# Patient Record
Sex: Female | Born: 1990 | Race: Black or African American | Hispanic: No | Marital: Single | Smoking: Former smoker
Health system: Southern US, Community
[De-identification: ages and names within clinical notes are randomized; demographics above are authoritative.]

## PROBLEM LIST (undated history)

## (undated) ENCOUNTER — Inpatient Hospital Stay (HOSPITAL_COMMUNITY): Payer: Self-pay

## (undated) DIAGNOSIS — Z789 Other specified health status: Secondary | ICD-10-CM

## (undated) DIAGNOSIS — Z3490 Encounter for supervision of normal pregnancy, unspecified, unspecified trimester: Secondary | ICD-10-CM

## (undated) DIAGNOSIS — A549 Gonococcal infection, unspecified: Secondary | ICD-10-CM

## (undated) DIAGNOSIS — F329 Major depressive disorder, single episode, unspecified: Secondary | ICD-10-CM

## (undated) DIAGNOSIS — F32A Depression, unspecified: Secondary | ICD-10-CM

## (undated) HISTORY — PX: NO PAST SURGERIES: SHX2092

---
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
## 2011-03-15 DIAGNOSIS — R87619 Unspecified abnormal cytological findings in specimens from cervix uteri: Secondary | ICD-10-CM | POA: Insufficient documentation

## 2016-07-04 ENCOUNTER — Emergency Department (HOSPITAL_COMMUNITY): Payer: Medicaid Other

## 2016-07-04 ENCOUNTER — Emergency Department (HOSPITAL_COMMUNITY)
Admission: EM | Admit: 2016-07-04 | Discharge: 2016-07-04 | Disposition: A | Discharge status: Home / Self Care | Payer: Medicaid Other | Attending: Emergency Medicine | Admitting: Emergency Medicine

## 2016-07-04 ENCOUNTER — Encounter (HOSPITAL_COMMUNITY): Payer: Self-pay | Admitting: Emergency Medicine

## 2016-07-04 DIAGNOSIS — B9689 Other specified bacterial agents as the cause of diseases classified elsewhere: Secondary | ICD-10-CM | POA: Insufficient documentation

## 2016-07-04 DIAGNOSIS — Z5181 Encounter for therapeutic drug level monitoring: Secondary | ICD-10-CM | POA: Insufficient documentation

## 2016-07-04 DIAGNOSIS — F17210 Nicotine dependence, cigarettes, uncomplicated: Secondary | ICD-10-CM | POA: Insufficient documentation

## 2016-07-04 DIAGNOSIS — K5900 Constipation, unspecified: Secondary | ICD-10-CM | POA: Insufficient documentation

## 2016-07-04 DIAGNOSIS — N760 Acute vaginitis: Secondary | ICD-10-CM | POA: Insufficient documentation

## 2016-07-04 DIAGNOSIS — R1030 Lower abdominal pain, unspecified: Secondary | ICD-10-CM | POA: Diagnosis present

## 2016-07-04 LAB — RAPID URINE DRUG SCREEN, HOSP PERFORMED
AMPHETAMINES: NOT DETECTED
BENZODIAZEPINES: NOT DETECTED
Barbiturates: NOT DETECTED
Cocaine: NOT DETECTED
Opiates: NOT DETECTED
TETRAHYDROCANNABINOL: POSITIVE — AB

## 2016-07-04 LAB — URINE MICROSCOPIC-ADD ON

## 2016-07-04 LAB — POC URINE PREG, ED: PREG TEST UR: NEGATIVE

## 2016-07-04 LAB — URINALYSIS, ROUTINE W REFLEX MICROSCOPIC
Bilirubin Urine: NEGATIVE
GLUCOSE, UA: NEGATIVE mg/dL
HGB URINE DIPSTICK: NEGATIVE
Ketones, ur: NEGATIVE mg/dL
Nitrite: NEGATIVE
PROTEIN: NEGATIVE mg/dL
pH: 6 (ref 5.0–8.0)

## 2016-07-04 LAB — WET PREP, GENITAL
SPERM: NONE SEEN
TRICH WET PREP: NONE SEEN
Yeast Wet Prep HPF POC: NONE SEEN

## 2016-07-04 MED ORDER — METRONIDAZOLE 500 MG PO TABS: 500.0000 mg | ORAL_TABLET | Freq: Two times a day (BID) | ORAL | 0 refills | Status: DC

## 2016-07-04 MED ORDER — KETOROLAC TROMETHAMINE 60 MG/2ML IM SOLN
60.0000 mg | Freq: Once | INTRAMUSCULAR | Status: AC
  Administered 2016-07-04: 60 mg via INTRAMUSCULAR
  Filled 2016-07-04: qty 2

## 2016-07-04 MED ORDER — DICYCLOMINE HCL 10 MG/ML IM SOLN
20.0000 mg | Freq: Once | INTRAMUSCULAR | Status: AC
  Administered 2016-07-04: 20 mg via INTRAMUSCULAR
  Filled 2016-07-04: qty 2

## 2016-07-04 MED ORDER — METRONIDAZOLE 500 MG PO TABS
500.0000 mg | ORAL_TABLET | Freq: Once | ORAL | Status: AC
  Administered 2016-07-04: 500 mg via ORAL
  Filled 2016-07-04: qty 1

## 2016-07-04 NOTE — ED Provider Notes (Signed)
[facility]PT Provider Note   CSN: [id number] Arrival date & time: 07/04/16  0227  By signing my name below, I, Wendy Atkins, attest that this documentation has been prepared under the direction and in the presence of Tanicka Bisaillon, MD. Electronically Signed: Doreatha Atkins, ED Scribe. 07/04/16. 3:32 AM.     History   Chief Complaint Chief Complaint  Patient presents with  . Abdominal Pain    HPI Wendy Atkins is a 25 y.o. female who presents to the Emergency Department complaining of moderate, cramping lower abdominal pain onset one hour ago with associated constipation. Pt states her last BM yesterday was hard and small. LMP 04/15/16. She states she had a miscarriage weeks ago, had misoprostol placed 06/11/16, and has not had her HCG levels checked since d/t a missed appointment on 06/18/16. Pt also notes she experienced vaginal bleeding for 10 days s/p misoprostol placement 06/11/16, but this has currently resolved. Pt does complain of some vaginal discharge at this time. She denies vomiting, dysuria, hematuria, frequency, urgency, diarrhea.   The history is provided by the patient. No language interpreter was used.  Abdominal Pain   This is a new problem. The current episode started 1 to 2 hours ago. The problem occurs constantly. The problem has not changed since onset.Associated with: miscarriage in September. The pain is located in the suprapubic region. The pain is moderate. Associated symptoms include flatus and constipation. Pertinent negatives include anorexia, fever, diarrhea, vomiting, dysuria, frequency and hematuria. Nothing aggravates the symptoms. Nothing relieves the symptoms. Past workup does not include GI consult. Her past medical history does not include PUD.    History reviewed. No pertinent past medical history.  There are no active problems to display for this patient.   History reviewed. No pertinent surgical history.  OB History    No data available        Home Medications    Prior to Admission medications   Not on File    Family History History reviewed. No pertinent family history.  Social History Social History  Substance Use Topics  . Smoking status: Current Every Day Smoker    Packs/day: 0.50    Years: 5.00    Types: Cigarettes  . Smokeless tobacco: Never Used  . Alcohol use 1.2 oz/week    2 Shots of liquor per week     Allergies   Review of patient's allergies indicates no known allergies.   Review of Systems Review of Systems  Constitutional: Negative for fever.  Respiratory: Negative for shortness of breath.   Cardiovascular: Negative for chest pain.  Gastrointestinal: Positive for abdominal pain, constipation and flatus. Negative for anorexia, diarrhea and vomiting.  Genitourinary: Positive for vaginal discharge. Negative for dysuria, frequency, hematuria, urgency and vaginal bleeding.  All other systems reviewed and are negative.   Physical Exam Updated Vital Signs BP 127/85 (BP Location: Right Arm)   Pulse 86   Temp 98.1 F (36.7 C) (Oral)   Resp 14   Ht 5\' 7"  (1.702 m)   Wt 217 lb (98.4 kg)   LMP 04/15/2016 (Exact Date)   SpO2 96%   BMI 33.99 kg/m   Physical Exam  Constitutional: She is oriented to person, place, and time. She appears well-developed and well-nourished.  HENT:  Head: Normocephalic and atraumatic.  Mouth/Throat: Oropharynx is clear and moist. No oropharyngeal exudate.  Eyes: Conjunctivae and EOM are normal. Pupils are equal, round, and reactive to light.  Neck: Normal range of motion. Neck supple. No JVD present.  No tracheal deviation present.  No carotid bruits. Trachea midline.   Cardiovascular: Normal rate, regular rhythm, normal heart sounds and intact distal pulses.  Exam reveals no gallop and no friction rub.   No murmur heard. RRR.   Pulmonary/Chest: Effort normal and breath sounds normal. No stridor. No respiratory distress. She has no wheezes. She has no rales.   Lungs CTA bilaterally.   Abdominal: Soft. She exhibits no distension and no mass. There is no tenderness. There is no rebound and no guarding.  Extremely gassy throughout. Constipation.   Genitourinary:  Genitourinary Comments: Chaperone present, scant vaginal discharge no CMT or adnexal tenderness  Musculoskeletal: Normal range of motion.  Lymphadenopathy:    She has no cervical adenopathy.  Neurological: She is alert and oriented to person, place, and time. She has normal reflexes.  Skin: Skin is warm and dry. Capillary refill takes less than 2 seconds.  Psychiatric: She has a normal mood and affect.  Nursing note and vitals reviewed.    ED Treatments / Results   DIAGNOSTIC STUDIES: Oxygen Saturation is 96% on RA, adequate by my interpretation.    COORDINATION OF CARE: 3:11 AM Discussed treatment plan with pt at bedside which includes UA and pt agreed to plan.    Labs (all labs ordered are listed, but only abnormal results are displayed) Labs Reviewed  URINALYSIS, ROUTINE W REFLEX MICROSCOPIC (NOT [facility])  POC URINE PREG, ED    EKG  EKG Interpretation None       Radiology No results found.  Procedures Procedures (including critical care time)  Medications Ordered in ED Medications - No data to display   Initial Impression / Assessment and Plan / ED Course  I have reviewed the triage vital signs and the nursing notes.  Pertinent labs & imaging results that were available during my care of the patient were reviewed by me and considered in my medical decision making (see chart for details).  Vitals:   07/04/16 0330 07/04/16 0345  BP: 100/57 (!) 94/54  Pulse: 85 90  Resp:    Temp:     Medications  ketorolac (TORADOL) injection 60 mg (60 mg Intramuscular Given 07/04/16 0429)  dicyclomine (BENTYL) injection 20 mg (20 mg Intramuscular Given 07/04/16 0430)    Final Clinical Impressions(s) / ED Diagnoses   New Prescriptions New Prescriptions   No  medications on file   Suspect this is cramping from constipation.  I do not believe this has anything to do with the miscarriage as pregnancy test is negative vitals and exam are benign and reassuring.  Start miralax for constipation and increase fiber in your diet. Will also treat for bacterial vaginosis.  Follow up with GYN.  All questions answered to patient's satisfaction. Based on history and exam patient has been appropriately medically screened and emergency conditions excluded. Patient is stable for discharge at this time. Follow up with your PMD for recheck in 2 days and strict return precautions given.   I personally performed the services described in this documentation, which was scribed in my presence. The recorded information has been reviewed and is accurate.     Cy BlamerApril Maurianna Benard, MD 07/04/16 [phone]

## 2016-07-04 NOTE — ED Triage Notes (Signed)
Pt c/o severe abdominal pain with cramping that began 2 days ago. Reports miscarriage approx 2 weeks ago. Pain comes in waves. Pt reports it feels like labor pains, like she needs to push. Denies N/V/D. Had BM yesterday but states that it was hard and believes she is constipated as well.

## 2016-07-04 NOTE — ED Notes (Signed)
Pt departed in NAD, refused use of wheelchair.  

## 2016-07-05 LAB — GC/CHLAMYDIA PROBE AMP (~~LOC~~) NOT AT ARMC
CHLAMYDIA, DNA PROBE: NEGATIVE
Neisseria Gonorrhea: NEGATIVE

## 2017-01-07 ENCOUNTER — Encounter (HOSPITAL_COMMUNITY): Payer: Self-pay | Admitting: Emergency Medicine

## 2017-01-07 ENCOUNTER — Emergency Department (HOSPITAL_COMMUNITY)
Admission: EM | Admit: 2017-01-07 | Discharge: 2017-01-07 | Disposition: A | Discharge status: Home / Self Care | Payer: Medicaid Other | Attending: Emergency Medicine | Admitting: Emergency Medicine

## 2017-01-07 DIAGNOSIS — Z5321 Procedure and treatment not carried out due to patient leaving prior to being seen by health care provider: Secondary | ICD-10-CM | POA: Insufficient documentation

## 2017-01-07 DIAGNOSIS — R55 Syncope and collapse: Secondary | ICD-10-CM | POA: Insufficient documentation

## 2017-01-07 LAB — CBC
HCT: 34.1 % — ABNORMAL LOW (ref 36.0–46.0)
Hemoglobin: 11.5 g/dL — ABNORMAL LOW (ref 12.0–15.0)
MCH: 29.3 pg (ref 26.0–34.0)
MCHC: 33.7 g/dL (ref 30.0–36.0)
MCV: 87 fL (ref 78.0–100.0)
PLATELETS: 283 10*3/uL (ref 150–400)
RBC: 3.92 MIL/uL (ref 3.87–5.11)
RDW: 13 % (ref 11.5–15.5)
WBC: 7.6 10*3/uL (ref 4.0–10.5)

## 2017-01-07 LAB — BASIC METABOLIC PANEL
Anion gap: 9 (ref 5–15)
BUN: 8 mg/dL (ref 6–20)
CHLORIDE: 104 mmol/L (ref 101–111)
CO2: 19 mmol/L — ABNORMAL LOW (ref 22–32)
Calcium: 9 mg/dL (ref 8.9–10.3)
Creatinine, Ser: 0.57 mg/dL (ref 0.44–1.00)
GFR calc Af Amer: >60 mL/min (ref >60)
GFR calc non Af Amer: >60 mL/min (ref >60)
GLUCOSE: 99 mg/dL (ref 65–99)
POTASSIUM: 3.4 mmol/L — AB (ref 3.5–5.1)
Sodium: 132 mmol/L — ABNORMAL LOW (ref 135–145)

## 2017-01-07 NOTE — ED Notes (Signed)
Called patient for rooming with no response.

## 2017-01-07 NOTE — ED Notes (Signed)
Called for V/S and no answer x2 
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
## 2017-01-31 ENCOUNTER — Emergency Department (HOSPITAL_COMMUNITY)
Admission: EM | Admit: 2017-01-31 | Discharge: 2017-01-31 | Disposition: A | Discharge status: Home / Self Care | Payer: No Typology Code available for payment source | Attending: Emergency Medicine | Admitting: Emergency Medicine

## 2017-01-31 DIAGNOSIS — Z3A16 16 weeks gestation of pregnancy: Secondary | ICD-10-CM | POA: Insufficient documentation

## 2017-01-31 DIAGNOSIS — Y9389 Activity, other specified: Secondary | ICD-10-CM | POA: Insufficient documentation

## 2017-01-31 DIAGNOSIS — O9A212 Injury, poisoning and certain other consequences of external causes complicating pregnancy, second trimester: Secondary | ICD-10-CM | POA: Insufficient documentation

## 2017-01-31 DIAGNOSIS — Z87891 Personal history of nicotine dependence: Secondary | ICD-10-CM | POA: Insufficient documentation

## 2017-01-31 DIAGNOSIS — S3991XA Unspecified injury of abdomen, initial encounter: Secondary | ICD-10-CM | POA: Diagnosis not present

## 2017-01-31 DIAGNOSIS — Y999 Unspecified external cause status: Secondary | ICD-10-CM | POA: Insufficient documentation

## 2017-01-31 DIAGNOSIS — R1030 Lower abdominal pain, unspecified: Secondary | ICD-10-CM

## 2017-01-31 DIAGNOSIS — Y92481 Parking lot as the place of occurrence of the external cause: Secondary | ICD-10-CM | POA: Insufficient documentation

## 2017-01-31 MED ORDER — ACETAMINOPHEN 325 MG PO TABS
650.0000 mg | ORAL_TABLET | Freq: Once | ORAL | Status: AC
  Administered 2017-01-31: 650 mg via ORAL
  Filled 2017-01-31: qty 2
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
## 2017-04-13 ENCOUNTER — Inpatient Hospital Stay (HOSPITAL_COMMUNITY)
Admission: AD | Admit: 2017-04-13 | Discharge: 2017-04-13 | Disposition: A | Discharge status: Home / Self Care | Payer: Medicaid Other | Source: Ambulatory Visit | Attending: Family Medicine | Admitting: Family Medicine

## 2017-04-13 ENCOUNTER — Encounter (HOSPITAL_COMMUNITY): Payer: Self-pay | Admitting: *Deleted

## 2017-04-13 DIAGNOSIS — R102 Pelvic and perineal pain: Secondary | ICD-10-CM | POA: Insufficient documentation

## 2017-04-13 DIAGNOSIS — Z87891 Personal history of nicotine dependence: Secondary | ICD-10-CM | POA: Diagnosis not present

## 2017-04-13 DIAGNOSIS — O26892 Other specified pregnancy related conditions, second trimester: Secondary | ICD-10-CM | POA: Insufficient documentation

## 2017-04-13 DIAGNOSIS — Z3A26 26 weeks gestation of pregnancy: Secondary | ICD-10-CM | POA: Diagnosis not present

## 2017-04-13 LAB — WET PREP, GENITAL
CLUE CELLS WET PREP: NONE SEEN
TRICH WET PREP: NONE SEEN
YEAST WET PREP: NONE SEEN

## 2017-04-13 LAB — URINALYSIS, ROUTINE W REFLEX MICROSCOPIC
Bilirubin Urine: NEGATIVE
Glucose, UA: 50 mg/dL — AB
Hgb urine dipstick: NEGATIVE
KETONES UR: NEGATIVE mg/dL
LEUKOCYTES UA: NEGATIVE
NITRITE: NEGATIVE
PROTEIN: NEGATIVE mg/dL
Specific Gravity, Urine: 1.016 (ref 1.005–1.030)
pH: 6 (ref 5.0–8.0)

## 2017-04-13 LAB — OB RESULTS CONSOLE GC/CHLAMYDIA
Chlamydia: NEGATIVE
GC PROBE AMP, GENITAL: NEGATIVE

## 2017-04-13 MED ORDER — COMFORT FIT MATERNITY SUPP MED MISC: 0 refills | Status: DC

## 2017-04-13 NOTE — MAU Note (Signed)
Pt C/O pelvic pressure "feel like I need to push" for the last 3 days, was seen in MD office on Monday, received medication for muscle spasms, states it is not working.  Started feeling SOB this morning, denies chest pain.  States she is SOB even when resting.

## 2017-04-13 NOTE — MAU Provider Note (Signed)
  History     CSN: [id number]  Arrival date and time: 04/13/17 1506   None     Chief Complaint  Patient presents with  . Pelvic Pain  . Shortness of Breath   HPI [age] yo G3P2012 at 26.6 weeks presenting today for the evaluation of pelvic pressure for the past 3 days along with some lower back pain. [facility] Dr[facility] Shawnie Ponsorn. She reports being seen recently for routine care and was prescribed flexeril which helped her lower back pain but not her pelvic pressure/discomfort. The pain is located on her pubic symphysis. She states that it is difficult to walk or roll over in bed due to the discomfort. She denies any contractions, leakage of fluid, or vaginal bleeding. She reports good fetal movement.  She also reports some shortness of breath with ambulation. She currently denies this complaint  OB History    Gravida Para Term Preterm AB Living   1             SAB TAB Ectopic Multiple Live Births                  History reviewed. No pertinent past medical history.  History reviewed. No pertinent surgical history.  No family history on file.  Social History  Substance Use Topics  . Smoking status: Former Smoker    Packs/day: 0.50    Years: 5.00    Types: Cigarettes  . Smokeless tobacco: Never Used  . Alcohol use 1.2 oz/week    2 Shots of liquor per week    Allergies: No Known Allergies  Prescriptions Prior to Admission  Medication Sig Dispense Refill Last Dose  . cyclobenzaprine (FLEXERIL) 10 MG tablet Take 1 tablet by mouth 3 (three) times daily as needed for muscle spasms.    04/13/2017 at Unknown time  . Prenatal Vit-Fe Fumarate-FA (PRENATAL MULTIVITAMIN) TABS tablet Take 1 tablet by mouth daily at 12 noon.   Past Week at Unknown time    Review of Systems  See pertinent in HPI Physical Exam   Blood pressure (!) 116/58, pulse 97, resp. rate (!) 24, last menstrual period 04/15/2016, SpO2 100 %.  Physical Exam GENERAL: Well-developed,  well-nourished female in no acute distress.  LUNGS: Clear to auscultation bilaterally.  HEART: Regular rate and rhythm. ABDOMEN: Soft, nontender, gravid PELVIC: Normal external female genitalia. Vagina is pink and rugated.  Copious discharge. Normal appearing cervix. Closed, thick and long EXTREMITIES: No cyanosis, clubbing, or edema, 2+ distal pulses.  MAU Course  Procedures  MDM Wet prep- negative UA - negative GC?CL- pemding  Assessment and Plan  26 yo G3P2012 at 26.6 weeks with pubic symphysis pain - Reassurance provided - Rx pregnancy support belt provided - Follow up with Dr. Shawnie Ponsorn as scheduled on 7/31 - Return to MAU prn  Wendy Atkins 04/13/2017, 4:57 PM

## 2017-04-14 LAB — GC/CHLAMYDIA PROBE AMP (~~LOC~~) NOT AT ARMC
Chlamydia: NEGATIVE
Neisseria Gonorrhea: NEGATIVE

## 2017-06-03 ENCOUNTER — Emergency Department (HOSPITAL_COMMUNITY)
Admission: EM | Admit: 2017-06-03 | Discharge: 2017-06-04 | Disposition: A | Discharge status: Home / Self Care | Payer: Medicaid Other | Attending: Emergency Medicine | Admitting: Emergency Medicine

## 2017-06-03 ENCOUNTER — Encounter (HOSPITAL_COMMUNITY): Payer: Self-pay | Admitting: Emergency Medicine

## 2017-06-03 DIAGNOSIS — Z79899 Other long term (current) drug therapy: Secondary | ICD-10-CM | POA: Insufficient documentation

## 2017-06-03 DIAGNOSIS — Z3A34 34 weeks gestation of pregnancy: Secondary | ICD-10-CM | POA: Insufficient documentation

## 2017-06-03 DIAGNOSIS — M545 Low back pain, unspecified: Secondary | ICD-10-CM

## 2017-06-03 DIAGNOSIS — O26899 Other specified pregnancy related conditions, unspecified trimester: Secondary | ICD-10-CM

## 2017-06-03 DIAGNOSIS — Z87891 Personal history of nicotine dependence: Secondary | ICD-10-CM | POA: Diagnosis not present

## 2017-06-03 DIAGNOSIS — O26893 Other specified pregnancy related conditions, third trimester: Secondary | ICD-10-CM | POA: Insufficient documentation

## 2017-06-03 DIAGNOSIS — R102 Pelvic and perineal pain: Secondary | ICD-10-CM | POA: Diagnosis not present

## 2017-06-03 HISTORY — DX: Encounter for supervision of normal pregnancy, unspecified, unspecified trimester: Z34.90

## 2017-06-03 NOTE — ED Triage Notes (Signed)
[redacted] weeks pregnant.  [facility].  C/o lower back pain that radiates to lower abd.  States she had pain this morning that resolved and started again this evening.  This is her third pregnancy and she doesn't feel like it is contractions.  Also reports constant vaginal pain x 3 days.

## 2017-06-03 NOTE — ED Notes (Signed)
NS notified to call rapid OB

## 2017-06-03 NOTE — ED Notes (Signed)
OB rapid called

## 2017-06-04 LAB — URINALYSIS, ROUTINE W REFLEX MICROSCOPIC
Bilirubin Urine: NEGATIVE
Glucose, UA: 50 mg/dL — AB
Hgb urine dipstick: NEGATIVE
Ketones, ur: NEGATIVE mg/dL
Leukocytes, UA: NEGATIVE
Nitrite: NEGATIVE
Protein, ur: NEGATIVE mg/dL
Specific Gravity, Urine: 1.018 (ref 1.005–1.030)
pH: 6 (ref 5.0–8.0)

## 2017-06-04 MED ORDER — ACETAMINOPHEN 325 MG PO TABS
650.0000 mg | ORAL_TABLET | Freq: Once | ORAL | Status: AC
  Administered 2017-06-04: 650 mg via ORAL
  Filled 2017-06-04: qty 2

## 2017-06-04 NOTE — ED Notes (Signed)
Pt having abd pain no vaginal bleeding.  3rd baby

## 2017-06-04 NOTE — ED Provider Notes (Signed)
[facility] Provider Note   CSN: [id number] Arrival date & time: 06/03/17  2259     History   Chief Complaint Chief Complaint  Patient presents with  . Back Pain    [redacted] weeks pregnant    HPI Wendy Atkins is a 26 y.o. female.  Patient who is [redacted] weeks pregnant, uncomplicated pregnancy, G3P2 (previous full-term, uncomplicated pregnancies, vaginal deliveries) presents with complaint of pelvic pain and low back pain. She reports the symptoms have been present for the past 1-2 months but 2 hours ago the back pain became intermittent, coming and going every few minutes. No vaginal bleeding or discharge. She reports good fetal activity. No change to pelvic pain. She has maintained a normal diet and sufficient hydration. No urinary symptoms, diarrhea or constipation. She has had regular prenatal care [facility], [facility].   The history is provided by the patient. No language interpreter was used.  Back Pain   Associated symptoms include pelvic pain. Pertinent negatives include no chest pain, no fever, no abdominal pain, no dysuria and no weakness.    Past Medical History:  Diagnosis Date  . Pregnant     There are no active problems to display for this patient.   History reviewed. No pertinent surgical history.  OB History    Gravida Para Term Preterm AB Living   1             SAB TAB Ectopic Multiple Live Births                   Home Medications    Prior to Admission medications   Medication Sig Start Date End Date Taking? Authorizing Provider  cyclobenzaprine (FLEXERIL) 10 MG tablet Take 1 tablet by mouth 3 (three) times daily as needed for muscle spasms.     [provider]  Elastic Bandages & Supports (COMFORT FIT MATERNITY SUPP MED) MISC Wear daily when ambulating 04/13/17   Constant, Peggy, MD  Prenatal Vit-Fe Fumarate-FA (PRENATAL MULTIVITAMIN) TABS tablet Take 1 tablet by mouth daily at 12 noon.    [provider]    Family  History No family history on file.  Social History Social History  Substance Use Topics  . Smoking status: Former Smoker    Packs/day: 0.50    Years: 5.00    Types: Cigarettes  . Smokeless tobacco: Never Used  . Alcohol use 1.2 oz/week    2 Shots of liquor per week     Allergies   Patient has no known allergies.   Review of Systems Review of Systems  Constitutional: Negative for appetite change, chills, diaphoresis and fever.  HENT: Negative.   Respiratory: Negative.  Negative for shortness of breath.   Cardiovascular: Negative.  Negative for chest pain.  Gastrointestinal: Negative.  Negative for abdominal pain, constipation, diarrhea and vomiting.  Genitourinary: Positive for pelvic pain. Negative for dysuria, hematuria, vaginal bleeding and vaginal discharge.  Musculoskeletal: Positive for back pain.  Skin: Negative.   Neurological: Negative.  Negative for weakness.     Physical Exam Updated Vital Signs BP 122/67 (BP Location: Right Arm)   Pulse (!) 103   Temp 98.3 F (36.8 C) (Oral)   Resp 18   LMP 04/15/2016 (Exact Date)   SpO2 97%   Physical Exam  Constitutional: She is oriented to person, place, and time. She appears well-developed and well-nourished.  Neck: Normal range of motion.  Pulmonary/Chest: Effort normal.  Abdominal: There is no tenderness.  Gravid abdomen.  Neurological: She is alert and oriented to person, place, and time.  Skin: Skin is warm and dry.     ED Treatments / Results  Labs (all labs ordered are listed, but only abnormal results are displayed) Labs Reviewed  URINALYSIS, ROUTINE W REFLEX MICROSCOPIC    EKG  EKG Interpretation None       Radiology No results found.  Procedures Procedures (including critical care time)  Medications Ordered in ED Medications - No data to display   Initial Impression / Assessment and Plan / ED Course  I have reviewed the triage vital signs and the nursing notes.  Pertinent labs &  imaging results that were available during my care of the patient were reviewed by me and considered in my medical decision making (see chart for details).     RROB-RN at bedside. Fetal monitoring shows no contractions. Will check urine for infection, continue to monitor, await results of pelvic exam performed by OB-RN.   OB RN reports closed Os, soft, as expected in G3P2. 1 contraction on the monitor during the 1-hour she was observed. RN discussed patient with on-call OB, Dr. Rande Lawman, who advises the patient can be discharged home if UA clear and should follow up with her OB next week.   UA negative. Re-evaluation of the patient finds her comfortable with persistent symptoms. Tylenol ordered. She is comfortable with plan of discharge.   Final Clinical Impressions(s) / ED Diagnoses   Final diagnoses:  None   1. Low back pain 2. Pelvic pain 3. Pregnant  New Prescriptions New Prescriptions   No medications on file     Elpidio Anis, Cordelia Poche 06/04/17 1610    Derwood Kaplan, MD 06/04/17 2129

## 2017-06-04 NOTE — Discharge Instructions (Signed)
You have been cleared for discharged. Call your OB on Monday to schedule appropriate recheck. Tylenol for discomfort. Return here[facility], or see your doctor immediately if symptoms change, become worse or for new concern.
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
## 2017-06-23 ENCOUNTER — Inpatient Hospital Stay (HOSPITAL_COMMUNITY)
Admission: AD | Admit: 2017-06-23 | Discharge: 2017-06-23 | Disposition: A | Discharge status: Home / Self Care | Payer: Medicaid Other | Source: Ambulatory Visit | Attending: Obstetrics & Gynecology | Admitting: Obstetrics & Gynecology

## 2017-06-23 ENCOUNTER — Encounter (HOSPITAL_COMMUNITY): Payer: Self-pay | Admitting: *Deleted

## 2017-06-23 DIAGNOSIS — O26893 Other specified pregnancy related conditions, third trimester: Secondary | ICD-10-CM

## 2017-06-23 DIAGNOSIS — R102 Pelvic and perineal pain: Secondary | ICD-10-CM | POA: Insufficient documentation

## 2017-06-23 DIAGNOSIS — R109 Unspecified abdominal pain: Secondary | ICD-10-CM

## 2017-06-23 DIAGNOSIS — Z87891 Personal history of nicotine dependence: Secondary | ICD-10-CM | POA: Diagnosis not present

## 2017-06-23 DIAGNOSIS — Z3A37 37 weeks gestation of pregnancy: Secondary | ICD-10-CM | POA: Diagnosis not present

## 2017-06-23 DIAGNOSIS — N949 Unspecified condition associated with female genital organs and menstrual cycle: Secondary | ICD-10-CM

## 2017-06-23 HISTORY — DX: Other specified health status: Z78.9

## 2017-06-23 NOTE — MAU Provider Note (Signed)
Chief Complaint:  Pelvic Pain   First Provider Initiated Contact with Patient 06/23/17 0804     HPI: Wendy Atkins is a 26 y.o. G1P0 at 52w0dwho presents to maternity admissions reporting constant pressure in vagina.  Not having contractions but wants to know why she feels pressure.  Did not call Dr Shawnie Pons.  Plans to deliver in [place] but lives in [place] which is why she comes here.. She reports good fetal movement, denies LOF, vaginal bleeding, vaginal itching/burning, urinary symptoms, h/a, dizziness, n/v, diarrhea, constipation or fever/chills.    Pelvic Pain  The patient's primary symptoms include pelvic pain. The patient's pertinent negatives include no genital itching, genital lesions, genital odor or vaginal bleeding. This is a new problem. The current episode started yesterday. The problem occurs constantly. The problem has been unchanged. The pain is mild. The problem affects both sides. She is pregnant. Pertinent negatives include no constipation, diarrhea, fever, nausea or vomiting. Nothing aggravates the symptoms. She has tried nothing for the symptoms.    RN Note: Pt reports pelvic pain since midnight , worsening since 4 am. Denies bleeding or ROM  Past Medical History: Past Medical History:  Diagnosis Date  . Medical history non-contributory   . Pregnant     Past obstetric history: OB History  Gravida Para Term Preterm AB Living  1            SAB TAB Ectopic Multiple Live Births               # Outcome Date GA Lbr Len/2nd Weight Sex Delivery Anes PTL Lv  1 Current               Past Surgical History: Past Surgical History:  Procedure Laterality Date  . NO PAST SURGERIES      Family History: History reviewed. No pertinent family history.  Social History: Social History  Substance Use Topics  . Smoking status: Former Smoker    Packs/day: 0.50    Years: 5.00    Types: Cigarettes  . Smokeless tobacco: Never Used  . Alcohol use 1.2 oz/week    2 Shots of liquor per week     Allergies: No Known Allergies  Meds:  Prescriptions Prior to Admission  Medication Sig Dispense Refill Last Dose  . cyclobenzaprine (FLEXERIL) 10 MG tablet Take 1 tablet by mouth 3 (three) times daily as needed for muscle spasms.    04/13/2017 at Unknown time  . Elastic Bandages & Supports (COMFORT FIT MATERNITY SUPP MED) MISC Wear daily when ambulating 1 each 0   . Prenatal Vit-Fe Fumarate-FA (PRENATAL MULTIVITAMIN) TABS tablet Take 1 tablet by mouth daily at 12 noon.   Past Week at Unknown time    I have reviewed patient's Past Medical Hx, Surgical Hx, Family Hx, Social Hx, medications and allergies.   ROS:  Review of Systems  Constitutional: Negative for fever.  Gastrointestinal: Negative for constipation, diarrhea, nausea and vomiting.  Genitourinary: Positive for pelvic pain.   Other systems negative  Physical Exam  Patient Vitals for the past 24 hrs:  BP Temp Temp src Pulse Resp SpO2 Height Weight  06/23/17 0721 122/64 98.4 F (36.9 C) Oral (!) 122 18 98 %  (1.702 m) 250 lb (113.4 kg)   Constitutional: Well-developed, well-nourished female in no acute distress.  Cardiovascular: normal rate and rhythm Respiratory: normal effort, clear to auscultation bilaterally GI: Abd soft, non-tender, gravid appropriate for gestational age.   No rebound or guarding. MS: Extremities nontender, no edema,  normal ROM Neurologic: Alert and oriented x 4.  GU: Neg CVAT.  PELVIC EXAM: Dilation: Fingertip Effacement (%): 50 Cervical Position: Posterior Station: -3 Presentation: Vertex Exam by:: Amila Callies cnm    FHT:  Baseline 140 , moderate variability, accelerations present, no decelerations Contractions:  Irregular and Rare   Labs: No results found for this or any previous visit (from the past 24 hour(s)).  Imaging:  No results found.  MAU Course/MDM: NST reviewed and found to be reactive with no contractions of any significance Discussed normal to feel pelvic pressure  as baby descends in to the pelvis before labor.  Asks "well, can you do something about it".  Discussed this is normal prelabor pressure and cannot be treated other than warm baths and some exericise mixed with rest  Assessment: [facility] at [redacted]w[redacted]d Pelvic pressure in pregnancy, antepartum, third trimester - Plan: Discharge patient   Plan: Discharge home Labor precautions and fetal kick counts Follow up in Office for prenatal visits and recheck of cervix Encouraged to return here or to other Urgent Care/ED if she develops worsening of symptoms, increase in pain, fever, or other concerning symptoms.   Pt stable at time of discharge.  Wynelle Bourgeois CNM, MSN Certified Nurse-Midwife 06/23/2017 8:20 AM

## 2017-06-23 NOTE — Discharge Instructions (Signed)
Vaginal Delivery Vaginal delivery means that you will give birth by pushing your baby out of your birth canal (vagina). A team of health care providers will help you before, during, and after vaginal delivery. Birth experiences are unique for every woman and every pregnancy, and birth experiences vary depending on where you choose to give birth. What should I do to prepare for my baby's birth? Before your baby is born, it is important to talk with your health care provider about:  Your labor and delivery preferences. These may include: ? Medicines that you may be given. ? How you will manage your pain. This might include non-medical pain relief techniques or injectable pain relief such as epidural analgesia. ? How you and your baby will be monitored during labor and delivery. ? Who may be in the labor and delivery room with you. ? Your feelings about surgical delivery of your baby (cesarean delivery, or C-section) if this becomes necessary. ? Your feelings about receiving donated blood through an IV tube (blood transfusion) if this becomes necessary.  Whether you are able: ? To take pictures or videos of the birth. ? To eat during labor and delivery. ? To move around, walk, or change positions during labor and delivery.  What to expect after your baby is born, such as: ? Whether delayed umbilical cord clamping and cutting is offered. ? Who will care for your baby right after birth. ? Medicines or tests that may be recommended for your baby. ? Whether breastfeeding is supported in your hospital or birth center. ? How long you will be in the hospital or birth center.  How any medical conditions you have may affect your baby or your labor and delivery experience.  To prepare for your baby's birth, you should also:  Attend all of your health care visits before delivery (prenatal visits) as recommended by your health care provider. This is important.  Prepare your home for your baby's  arrival. Make sure that you have: ? Diapers. ? Baby clothing. ? Feeding equipment. ? Safe sleeping arrangements for you and your baby.  Install a car seat in your vehicle. Have your car seat checked by a certified car seat installer to make sure that it is installed safely.  Think about who will help you with your new baby at home for at least the first several weeks after delivery.  What can I expect when I arrive at the birth center or hospital? Once you are in labor and have been admitted into the hospital or birth center, your health care provider may:  Review your pregnancy history and any concerns you have.  Insert an IV tube into one of your veins. This is used to give you fluids and medicines.  Check your blood pressure, pulse, temperature, and heart rate (vital signs).  Check whether your bag of water (amniotic sac) has broken (ruptured).  Talk with you about your birth plan and discuss pain control options.  Monitoring Your health care provider may monitor your contractions (uterine monitoring) and your baby's heart rate (fetal monitoring). You may need to be monitored:  Often, but not continuously (intermittently).  All the time or for long periods at a time (continuously). Continuous monitoring may be needed if: ? You are taking certain medicines, such as medicine to relieve pain or make your contractions stronger. ? You have pregnancy or labor complications.  Monitoring may be done by:  Placing a special stethoscope or a handheld monitoring device on your abdomen to   check your baby's heartbeat, and feeling your abdomen for contractions. This method of monitoring does not continuously record your baby's heartbeat or your contractions.  Placing monitors on your abdomen (external monitors) to record your baby's heartbeat and the frequency and length of contractions. You may not have to wear external monitors all the time.  Placing monitors inside of your uterus  (internal monitors) to record your baby's heartbeat and the frequency, length, and strength of your contractions. ? Your health care provider may use internal monitors if he or she needs more information about the strength of your contractions or your baby's heart rate. ? Internal monitors are put in place by passing a thin, flexible wire through your vagina and into your uterus. Depending on the type of monitor, it may remain in your uterus or on your baby's head until birth. ? Your health care provider will discuss the benefits and risks of internal monitoring with you and will ask for your permission before inserting the monitors.  Telemetry. This is a type of continuous monitoring that can be done with external or internal monitors. Instead of having to stay in bed, you are able to move around during telemetry. Ask your health care provider if telemetry is an option for you.  Physical exam Your health care provider may perform a physical exam. This may include:  Checking whether your baby is positioned: ? With the head toward your vagina (head-down). This is most common. ? With the head toward the top of your uterus (head-up or breech). If your baby is in a breech position, your health care provider may try to turn your baby to a head-down position so you can deliver vaginally. If it does not seem that your baby can be born vaginally, your provider may recommend surgery to deliver your baby. In rare cases, you may be able to deliver vaginally if your baby is head-up (breech delivery). ? Lying sideways (transverse). Babies that are lying sideways cannot be delivered vaginally.  Checking your cervix to determine: ? Whether it is thinning out (effacing). ? Whether it is opening up (dilating). ? How low your baby has moved into your birth canal.  What are the three stages of labor and delivery?  Normal labor and delivery is divided into the following three stages: Stage 1  Stage 1 is the  longest stage of labor, and it can last for hours or days. Stage 1 includes: ? Early labor. This is when contractions may be irregular, or regular and mild. Generally, early labor contractions are more than 10 minutes apart. ? Active labor. This is when contractions get longer, more regular, more frequent, and more intense. ? The transition phase. This is when contractions happen very close together, are very intense, and may last longer than during any other part of labor.  Contractions generally feel mild, infrequent, and irregular at first. They get stronger, more frequent (about every 2-3 minutes), and more regular as you progress from early labor through active labor and transition.  Many women progress through stage 1 naturally, but you may need help to continue making progress. If this happens, your health care provider may talk with you about: ? Rupturing your amniotic sac if it has not ruptured yet. ? Giving you medicine to help make your contractions stronger and more frequent.  Stage 1 ends when your cervix is completely dilated to 4 inches (10 cm) and completely effaced. This happens at the end of the transition phase. Stage 2  Once   your cervix is completely effaced and dilated to 4 inches (10 cm), you may start to feel an urge to push. It is common for the body to naturally take a rest before feeling the urge to push, especially if you received an epidural or certain other pain medicines. This rest period may last for up to 1-2 hours, depending on your unique labor experience.  During stage 2, contractions are generally less painful, because pushing helps relieve contraction pain. Instead of contraction pain, you may feel stretching and burning pain, especially when the widest part of your baby's head passes through the vaginal opening (crowning).  Your health care provider will closely monitor your pushing progress and your baby's progress through the vagina during stage 2.  Your  health care provider may massage the area of skin between your vaginal opening and anus (perineum) or apply warm compresses to your perineum. This helps it stretch as the baby's head starts to crown, which can help prevent perineal tearing. ? In some cases, an incision may be made in your perineum (episiotomy) to allow the baby to pass through the vaginal opening. An episiotomy helps to make the opening of the vagina larger to allow more room for the baby to fit through.  It is very important to breathe and focus so your health care provider can control the delivery of your baby's head. Your health care provider may have you decrease the intensity of your pushing, to help prevent perineal tearing.  After delivery of your baby's head, the shoulders and the rest of the body generally deliver very quickly and without difficulty.  Once your baby is delivered, the umbilical cord may be cut right away, or this may be delayed for 1-2 minutes, depending on your baby's health. This may vary among health care providers, hospitals, and birth centers.  If you and your baby are healthy enough, your baby may be placed on your chest or abdomen to help maintain the baby's temperature and to help you bond with each other. Some mothers and babies start breastfeeding at this time. Your health care team will dry your baby and help keep your baby warm during this time.  Your baby may need immediate care if he or she: ? Showed signs of distress during labor. ? Has a medical condition. ? Was born too early (prematurely). ? Had a bowel movement before birth (meconium). ? Shows signs of difficulty transitioning from being inside the uterus to being outside of the uterus. If you are planning to breastfeed, your health care team will help you begin a feeding. Stage 3  The third stage of labor starts immediately after the birth of your baby and ends after you deliver the placenta. The placenta is an organ that develops  during pregnancy to provide oxygen and nutrients to your baby in the womb.  Delivering the placenta may require some pushing, and you may have mild contractions. Breastfeeding can stimulate contractions to help you deliver the placenta.  After the placenta is delivered, your uterus should tighten (contract) and become firm. This helps to stop bleeding in your uterus. To help your uterus contract and to control bleeding, your health care provider may: ? Give you medicine by injection, through an IV tube, by mouth, or through your rectum (rectally). ? Massage your abdomen or perform a vaginal exam to remove any blood clots that are left in your uterus. ? Empty your bladder by placing a thin, flexible tube (catheter) into your bladder. ? Encourage   you to breastfeed your baby. After labor is over, you and your baby will be monitored closely to ensure that you are both healthy until you are ready to go home. Your health care team will teach you how to care for yourself and your baby. This information is not intended to replace advice given to you by your health care provider. Make sure you discuss any questions you have with your health care provider. Document Released: 06/22/2008 Document Revised: 04/02/2016 Document Reviewed: 09/28/2015 Elsevier Interactive Patient Education  2018 [employer]  

## 2017-06-23 NOTE — MAU Note (Signed)
Pt reports pelvic pain since midnight , worsening since 4 am. Denies bleeding or ROM

## 2017-10-06 ENCOUNTER — Encounter (HOSPITAL_COMMUNITY): Payer: Self-pay

## 2018-04-14 DIAGNOSIS — Z3046 Encounter for surveillance of implantable subdermal contraceptive: Secondary | ICD-10-CM | POA: Diagnosis not present

## 2018-04-14 DIAGNOSIS — Z30011 Encounter for initial prescription of contraceptive pills: Secondary | ICD-10-CM | POA: Diagnosis not present

## 2018-06-26 ENCOUNTER — Encounter (HOSPITAL_COMMUNITY): Payer: Self-pay

## 2018-06-26 ENCOUNTER — Inpatient Hospital Stay (HOSPITAL_COMMUNITY)
Admission: AD | Admit: 2018-06-26 | Discharge: 2018-06-27 | Disposition: A | Discharge status: Home / Self Care | Payer: Medicaid Other | Source: Ambulatory Visit | Attending: Obstetrics and Gynecology | Admitting: Obstetrics and Gynecology

## 2018-06-26 DIAGNOSIS — R109 Unspecified abdominal pain: Secondary | ICD-10-CM

## 2018-06-26 DIAGNOSIS — B9689 Other specified bacterial agents as the cause of diseases classified elsewhere: Secondary | ICD-10-CM | POA: Diagnosis not present

## 2018-06-26 DIAGNOSIS — N760 Acute vaginitis: Secondary | ICD-10-CM | POA: Diagnosis not present

## 2018-06-26 DIAGNOSIS — O3411 Maternal care for benign tumor of corpus uteri, first trimester: Secondary | ICD-10-CM | POA: Diagnosis not present

## 2018-06-26 DIAGNOSIS — O26891 Other specified pregnancy related conditions, first trimester: Secondary | ICD-10-CM | POA: Diagnosis not present

## 2018-06-26 DIAGNOSIS — O26899 Other specified pregnancy related conditions, unspecified trimester: Secondary | ICD-10-CM

## 2018-06-26 DIAGNOSIS — Z3A08 8 weeks gestation of pregnancy: Secondary | ICD-10-CM | POA: Diagnosis not present

## 2018-06-26 DIAGNOSIS — Z87891 Personal history of nicotine dependence: Secondary | ICD-10-CM | POA: Insufficient documentation

## 2018-06-26 DIAGNOSIS — O3680X0 Pregnancy with inconclusive fetal viability, not applicable or unspecified: Secondary | ICD-10-CM

## 2018-06-26 DIAGNOSIS — O23591 Infection of other part of genital tract in pregnancy, first trimester: Secondary | ICD-10-CM | POA: Insufficient documentation

## 2018-06-26 DIAGNOSIS — Z3A01 Less than 8 weeks gestation of pregnancy: Secondary | ICD-10-CM | POA: Diagnosis not present

## 2018-06-26 LAB — POCT PREGNANCY, URINE: Preg Test, Ur: POSITIVE — AB

## 2018-06-26 NOTE — MAU Note (Signed)
Pt here with c/o abdominal and back pain. Had + HPT; [facility] and [facility] didn't see anything in the uterus and told her to come here for evaluation. Started having pain today and came in.

## 2018-06-27 ENCOUNTER — Inpatient Hospital Stay (HOSPITAL_COMMUNITY): Payer: Medicaid Other

## 2018-06-27 DIAGNOSIS — R109 Unspecified abdominal pain: Secondary | ICD-10-CM

## 2018-06-27 DIAGNOSIS — O3411 Maternal care for benign tumor of corpus uteri, first trimester: Secondary | ICD-10-CM | POA: Diagnosis not present

## 2018-06-27 DIAGNOSIS — O26891 Other specified pregnancy related conditions, first trimester: Secondary | ICD-10-CM

## 2018-06-27 DIAGNOSIS — O3680X0 Pregnancy with inconclusive fetal viability, not applicable or unspecified: Secondary | ICD-10-CM

## 2018-06-27 DIAGNOSIS — B9689 Other specified bacterial agents as the cause of diseases classified elsewhere: Secondary | ICD-10-CM | POA: Diagnosis not present

## 2018-06-27 DIAGNOSIS — N760 Acute vaginitis: Secondary | ICD-10-CM | POA: Diagnosis not present

## 2018-06-27 DIAGNOSIS — Z3A01 Less than 8 weeks gestation of pregnancy: Secondary | ICD-10-CM | POA: Diagnosis not present

## 2018-06-27 HISTORY — DX: Other specified bacterial agents as the cause of diseases classified elsewhere: B96.89

## 2018-06-27 LAB — WET PREP, GENITAL
Sperm: NONE SEEN
TRICH WET PREP: NONE SEEN
YEAST WET PREP: NONE SEEN

## 2018-06-27 LAB — CBC
HEMATOCRIT: 38.3 % (ref 36.0–46.0)
HEMOGLOBIN: 12.9 g/dL (ref 12.0–15.0)
MCH: 29.7 pg (ref 26.0–34.0)
MCHC: 33.7 g/dL (ref 30.0–36.0)
MCV: 88 fL (ref 78.0–100.0)
Platelets: 289 10*3/uL (ref 150–400)
RBC: 4.35 MIL/uL (ref 3.87–5.11)
RDW: 13.2 % (ref 11.5–15.5)
WBC: 5.8 10*3/uL (ref 4.0–10.5)

## 2018-06-27 LAB — URINALYSIS, ROUTINE W REFLEX MICROSCOPIC
Bacteria, UA: NONE SEEN
Bilirubin Urine: NEGATIVE
GLUCOSE, UA: NEGATIVE mg/dL
Hgb urine dipstick: NEGATIVE
Ketones, ur: NEGATIVE mg/dL
Nitrite: NEGATIVE
PROTEIN: NEGATIVE mg/dL
Specific Gravity, Urine: 1.02 (ref 1.005–1.030)
pH: 6 (ref 5.0–8.0)

## 2018-06-27 LAB — HIV ANTIBODY (ROUTINE TESTING W REFLEX): HIV Screen 4th Generation wRfx: NONREACTIVE

## 2018-06-27 LAB — ABO/RH: ABO/RH(D): B POS

## 2018-06-27 LAB — GC/CHLAMYDIA PROBE AMP (~~LOC~~) NOT AT ARMC
Chlamydia: NEGATIVE
Neisseria Gonorrhea: NEGATIVE

## 2018-06-27 LAB — HCG, QUANTITATIVE, PREGNANCY: HCG, BETA CHAIN, QUANT, S: 5562 m[IU]/mL — AB (ref <5)

## 2018-06-27 MED ORDER — METRONIDAZOLE 500 MG PO TABS: 500.0000 mg | ORAL_TABLET | Freq: Two times a day (BID) | ORAL | 0 refills | Status: AC

## 2018-06-27 NOTE — MAU Provider Note (Signed)
History     CSN: [id number]  Arrival date and time: 06/26/18 2321   First Provider Initiated Contact with Patient 06/27/18 0010      Chief Complaint  Patient presents with  . Abdominal Pain  . Back Pain   HPI  Ms.  Wendy Atkins is a 27 y.o. year old G6P3013 female at [redacted]w[redacted]d weeks gestation who presents to MAU reporting (+) HPT. [facility] went to [facility] for [facility] on [facility]/23/2018; where they did an U/S and didn't see anything in the uterus. [facility] told [facility] to come to [facility]U for evaluation on Friday 06/23/18. She reports that she started having pain today and came in today.   Past Medical History:  Diagnosis Date  . Medical history non-contributory   . Pregnant     Past Surgical History:  Procedure Laterality Date  . NO PAST SURGERIES      History reviewed. No pertinent family history.  Social History   Tobacco Use  . Smoking status: Former Smoker    Packs/day: 0.50    Years: 5.00    Pack years: 2.50    Types: Cigarettes  . Smokeless tobacco: Never Used  Substance Use Topics  . Alcohol use: Yes    Alcohol/week: 2.0 standard drinks    Types: 2 Shots of liquor per week  . Drug use: No    Allergies: No Known Allergies  Medications Prior to Admission  Medication Sig Dispense Refill Last Dose  . cyclobenzaprine (FLEXERIL) 10 MG tablet Take 1 tablet by mouth 3 (three) times daily as needed for muscle spasms.    04/13/2017 at Unknown time  . Elastic Bandages & Supports (COMFORT FIT MATERNITY SUPP MED) MISC Wear daily when ambulating 1 each 0   . Prenatal Vit-Fe Fumarate-FA (PRENATAL MULTIVITAMIN) TABS tablet Take 1 tablet by mouth daily at 12 noon.   Past Week at Unknown time    Review of Systems  Constitutional: Negative.   HENT: Negative.   Eyes: Negative.   Respiratory: Negative.   Cardiovascular: Negative.   Gastrointestinal: Positive for abdominal pain.  Endocrine: Negative.   Genitourinary: Positive for pelvic pain.  Musculoskeletal:  Negative.   Skin: Negative.   Allergic/Immunologic: Negative.   Neurological: Negative.   Hematological: Negative.   Psychiatric/Behavioral: Negative.    Physical Exam   Blood pressure (!) 143/81, pulse 89, temperature 98.1 F (36.7 C), temperature source Oral, resp. rate 18, last menstrual period 05/02/2018, unknown if currently breastfeeding.  Physical Exam  Nursing note and vitals reviewed. Constitutional: She is oriented to person, place, and time. She appears well-developed and well-nourished.  HENT:  Head: Normocephalic and atraumatic.  Eyes: Pupils are equal, round, and reactive to light.  Neck: Normal range of motion.  Cardiovascular: Normal rate.  Respiratory: Effort normal.  GI: Soft.  Musculoskeletal: Normal range of motion.  Neurological: She is alert and oriented to person, place, and time.  Skin: Skin is warm.  Psychiatric: She has a normal mood and affect. Her behavior is normal. Judgment and thought content normal.    MAU Course  Procedures  MDM CCUA UPT CBC ABO/Rh HCG Wet Prep GC/CT -- pending HIV -- pending OB < 14 wks [place] with TV  Results for orders placed or performed during the hospital encounter of 06/26/18 (from the past 24 hour(s))  Urinalysis, Routine w reflex microscopic     Status: Abnormal   Collection Time: 06/26/18 11:42 PM  Result Value Ref Range   Color, Urine YELLOW YELLOW  APPearance HAZY (A) CLEAR   Specific Gravity, Urine 1.020 1.005 - 1.030   pH 6.0 5.0 - 8.0   Glucose, UA NEGATIVE NEGATIVE mg/dL   Hgb urine dipstick NEGATIVE NEGATIVE   Bilirubin Urine NEGATIVE NEGATIVE   Ketones, ur NEGATIVE NEGATIVE mg/dL   Protein, ur NEGATIVE NEGATIVE mg/dL   Nitrite NEGATIVE NEGATIVE   Leukocytes, UA SMALL (A) NEGATIVE   RBC / HPF 0-5 0 - 5 RBC/hpf   WBC, UA 6-10 0 - 5 WBC/hpf   Bacteria, UA NONE SEEN NONE SEEN   Squamous Epithelial / LPF 21-50 0 - 5   Mucus PRESENT   Pregnancy, urine POC     Status: Abnormal   Collection Time:  06/26/18 11:52 PM  Result Value Ref Range   Preg Test, Ur POSITIVE (A) NEGATIVE  Wet prep, genital     Status: Abnormal   Collection Time: 06/27/18 12:31 AM  Result Value Ref Range   Yeast Wet Prep HPF POC NONE SEEN NONE SEEN   Trich, Wet Prep NONE SEEN NONE SEEN   Clue Cells Wet Prep HPF POC PRESENT (A) NONE SEEN   WBC, Wet Prep HPF POC MODERATE (A) NONE SEEN   Sperm NONE SEEN   CBC     Status: None   Collection Time: 06/27/18 12:34 AM  Result Value Ref Range   WBC 5.8 4.0 - 10.5 K/uL   RBC 4.35 3.87 - 5.11 MIL/uL   Hemoglobin 12.9 12.0 - 15.0 g/dL   HCT 16.1 09.6 - 04.5 %   MCV 88.0 78.0 - 100.0 fL   MCH 29.7 26.0 - 34.0 pg   MCHC 33.7 30.0 - 36.0 g/dL   RDW 40.9 81.1 - 91.4 %   Platelets 289 150 - 400 K/uL  ABO/Rh     Status: None (Preliminary result)   Collection Time: 06/27/18 12:34 AM  Result Value Ref Range   ABO/RH(D)      B POS Performed at [facility], [address], [place], [place] [address]   hCG, quantitative, pregnancy     Status: Abnormal   Collection Time: 06/27/18 12:34 AM  Result Value Ref Range   hCG, Beta Chain, Quant, S 5,562 (H) <5 mIU/mL    US Ob Less Than 14 Weeks With Ob Transvaginal  Result Date: 06/27/2018 CLINICAL DATA:  Abdominal and back pain today. Positive pregnancy test. Gestational age by ultrasound 8 weeks and 0 days. EXAM: OBSTETRIC <14 WK [place] AND TRANSVAGINAL OB US TECHNIQUE: Both transabdominal and transvaginal ultrasound examinations were performed for complete evaluation of the gestation as well as the maternal uterus, adnexal regions, and pelvic cul-de-sac. Transvaginal technique was performed to assess early pregnancy. COMPARISON:  None. FINDINGS: Intrauterine gestational sac: Small amount of free fluid in upper endometrial cavity versus gestational sac. Second cystic 10 mm potential gestational sac with atypical thick walls. Yolk sac:  Not present. Embryo:  Not present. Cardiac Activity: Not present. MSD: 10 mm   5 w   5 d  Subchorionic hemorrhage:  Small suspected subchorionic hemorrhage. Maternal uterus/adnexae: Normal appearance of the adnexa. No free fluid. Intra-ovarian LEFT corpus luteal cyst. 2 cm RIGHT intramural leiomyoma. IMPRESSION: Probable early intrauterine gestational sac(s), but no yolk sac, fetal pole, or cardiac activity yet visualized. Small suspected subchorionic hemorrhage. Recommend follow-up quantitative B-HCG levels and follow-up US in 14 days to assess viability. This recommendation follows SRU consensus guidelines: Diagnostic Criteria for Nonviable Pregnancy Early in the First Trimester. Malva Limes Med 2013; 621:3086-57. Electronically Signed  By: Awilda Metro M.D.   On: 06/27/2018 01:36     Assessment and Plan  Bacterial vaginitis  - Rx for Flagyl 500 mg BID x 7 days - Information provided on BV  Abdominal pain affecting pregnancy  - Can take Tylenol 1000 mg every 6 hrs prn pain - Information provided on abd pain in pregnancy   Pregnancy of unknown anatomic location  - Repeat HCG on Thursday 06/29/2018 @ 1:30 PM - Information provided on ectopic pregnancy and threatened miscarriage  - Discharge patient - Keep scheduled appt with [facility] - Patient verbalized an understanding of the plan of care and agrees.     Raelyn Mora, MSN, CNM 06/27/2018, 12:10 AM

## 2018-06-29 ENCOUNTER — Ambulatory Visit (INDEPENDENT_AMBULATORY_CARE_PROVIDER_SITE_OTHER): Payer: Medicaid Other | Admitting: General Practice

## 2018-06-29 ENCOUNTER — Telehealth: Payer: Self-pay | Admitting: *Deleted

## 2018-06-29 ENCOUNTER — Telehealth: Payer: Self-pay | Admitting: General Practice

## 2018-06-29 DIAGNOSIS — O3680X0 Pregnancy with inconclusive fetal viability, not applicable or unspecified: Secondary | ICD-10-CM

## 2018-06-29 LAB — HCG, QUANTITATIVE, PREGNANCY: hCG, Beta Chain, Quant, S: 9096 m[IU]/mL — ABNORMAL HIGH (ref <5)

## 2018-06-29 NOTE — Progress Notes (Signed)
Patient presents to office today for stat bhcg. Patient denies pain or bleeding, states she just feels "lightheaded/weird". Discussed with patient we are monitoring your bhcg levels today & asked she wait in lobby for results/updated plan of care. Patient verbalized understanding & had no questions at this time.   Reviewed results with Dr Debroah Loop who finds appropriate rise in bhcg levels. Patient should follow up for repeat ultrasound in 7-10 days.   Patient left the lobby before results were back due to child care issues. Will call patient with results.

## 2018-06-29 NOTE — Telephone Encounter (Signed)
Called patient & informed her of bhcg results & recommended follow up. Patient verbalized understanding and states she plans a EAB. Told patient she can follow up with that provider as desired. Patient verbalized understanding & had no questions.

## 2018-06-29 NOTE — Telephone Encounter (Signed)
Spoke with the nurse earlier and had more follow up questions.  Please call back.

## 2018-06-30 NOTE — Telephone Encounter (Signed)
Called patient & she states she is confused about her blood work. Reviewed values again with patient and discussed values are continuing to increase indicating a progression of pregnancy. Patient verbalized understanding and she states she would like to have a follow up ultrasound scheduled because she doesn't want to go to [facility] too soon again and they still not see something in her uterus because she'll have to pay them another $150 for that. Scheduled follow up ultrasound 10/14 @ 9am & informed patient. Patient verbalized understanding & had no questions.

## 2018-07-10 ENCOUNTER — Ambulatory Visit (HOSPITAL_COMMUNITY): Payer: Medicaid Other | Attending: Obstetrics & Gynecology

## 2018-08-01 ENCOUNTER — Encounter (HOSPITAL_COMMUNITY): Payer: Self-pay | Admitting: *Deleted

## 2018-08-01 ENCOUNTER — Other Ambulatory Visit: Payer: Self-pay

## 2018-08-01 ENCOUNTER — Inpatient Hospital Stay (HOSPITAL_COMMUNITY): Payer: Medicaid Other

## 2018-08-01 ENCOUNTER — Inpatient Hospital Stay (HOSPITAL_COMMUNITY)
Admission: AD | Admit: 2018-08-01 | Discharge: 2018-08-01 | Disposition: A | Discharge status: Home / Self Care | Payer: Medicaid Other | Source: Ambulatory Visit | Attending: Family Medicine | Admitting: Family Medicine

## 2018-08-01 DIAGNOSIS — O26891 Other specified pregnancy related conditions, first trimester: Secondary | ICD-10-CM

## 2018-08-01 DIAGNOSIS — O26899 Other specified pregnancy related conditions, unspecified trimester: Secondary | ICD-10-CM

## 2018-08-01 DIAGNOSIS — Z3A13 13 weeks gestation of pregnancy: Secondary | ICD-10-CM

## 2018-08-01 DIAGNOSIS — O020 Blighted ovum and nonhydatidiform mole: Secondary | ICD-10-CM

## 2018-08-01 DIAGNOSIS — R1030 Lower abdominal pain, unspecified: Secondary | ICD-10-CM | POA: Diagnosis present

## 2018-08-01 DIAGNOSIS — O26892 Other specified pregnancy related conditions, second trimester: Secondary | ICD-10-CM | POA: Diagnosis present

## 2018-08-01 DIAGNOSIS — R109 Unspecified abdominal pain: Secondary | ICD-10-CM | POA: Diagnosis not present

## 2018-08-01 LAB — URINALYSIS, ROUTINE W REFLEX MICROSCOPIC
Bilirubin Urine: NEGATIVE
GLUCOSE, UA: NEGATIVE mg/dL
HGB URINE DIPSTICK: NEGATIVE
Ketones, ur: NEGATIVE mg/dL
NITRITE: NEGATIVE
PH: 6 (ref 5.0–8.0)
Protein, ur: NEGATIVE mg/dL
Specific Gravity, Urine: 1.023 (ref 1.005–1.030)

## 2018-08-01 LAB — CBC
HEMATOCRIT: 36.4 % (ref 36.0–46.0)
Hemoglobin: 12 g/dL (ref 12.0–15.0)
MCH: 29.6 pg (ref 26.0–34.0)
MCHC: 33 g/dL (ref 30.0–36.0)
MCV: 89.7 fL (ref 80.0–100.0)
NRBC: 0 % (ref 0.0–0.2)
Platelets: 316 10*3/uL (ref 150–400)
RBC: 4.06 MIL/uL (ref 3.87–5.11)
RDW: 12.9 % (ref 11.5–15.5)
WBC: 6.1 10*3/uL (ref 4.0–10.5)

## 2018-08-01 LAB — HCG, QUANTITATIVE, PREGNANCY: hCG, Beta Chain, Quant, S: 106082 m[IU]/mL — ABNORMAL HIGH (ref <5)

## 2018-08-01 MED ORDER — TRAMADOL HCL 50 MG PO TABS: 50.0000 mg | ORAL_TABLET | Freq: Four times a day (QID) | ORAL | 0 refills | Status: AC | PRN

## 2018-08-01 MED ORDER — HYDROMORPHONE HCL 1 MG/ML IJ SOLN
1.0000 mg | Freq: Once | INTRAMUSCULAR | Status: AC
  Administered 2018-08-01: 1 mg via INTRAMUSCULAR
  Filled 2018-08-01: qty 1

## 2018-08-01 NOTE — Discharge Instructions (Signed)
Molar Pregnancy A molar pregnancy (hydatidiform mole) is a mass of tissue that grows in the uterus after conception. The mass is created by an egg that was not fertilized correctly and abnormally grows. It is an abnormal pregnancy and does not develop into a fetus. If a molar pregnancy is suspected by your health care provider, treatment is required. What are the causes? Molar pregnancy is caused by an egg that is fertilized incorrectly so that it has abnormal genetic material (chromosomes). This can result in one of 2 types of molar pregnancy:  Complete molar pregnancy-All of the chromosomes in the fertilized egg come from the father; none come from the mother.  Partial molar pregnancy-The fertilized egg has chromosomes from the father and mother, but it has too many chromosomes.  What increases the risk? Certain risk factors make a molar pregnancy more likely. They include:  Being over age 35 or under age 20.  History of a molar pregnancy in the past (extremely small chance of recurrence).  Other possible risk factors include:  Smoking more than 15 cigarettes per day.  History of infertility.  Having a certain blood type (A, B, AB).  Having a vitamin A deficiency.  Using oral contraceptives.  What are the signs or symptoms?  Vaginal bleeding.  Missed menstrual period.  Uterus grows quicker than normal.  Severe nausea and vomiting.  Severe pressure or pain in the uterus.  Abnormal ovarian cysts (theca lutein cysts).  Discharge from the vagina that looks like grapes.  High blood pressure (early onset of preeclampsia).  Overactive thyroid (hyperthyroidism).  Anemia. How is this diagnosed? If your health care provider thinks there is a chance of a molar pregnancy, testing will be recommended. Possible tests include:  An ultrasound test.  Blood tests.  How is this treated? Most molar pregnancies end on their own by miscarriage. However, a health care provider  needs to make sure that all the abnormal tissue is out of the womb. This can be done with dilation and curettage (D&C) or suction curettage. In this procedure, any remaining molar tissue is removed through the vagina. After diagnosis of a molar pregnancy, the pregnancy hormone levels must be followed until the level is zero. If the pregnancy hormone level does not drop appropriately, chemotherapy may be necessary. Also, you will be given a medicine called Rho (D) immune globulin if you are Rh negative and your sex partner is Rh positive. This helps prevent Rh problems in future pregnancies. Follow these instructions at home:  Avoid getting pregnant for 6-12 months or as directed by your health care provider. Use a reliable form of birth control or do not have sex.  Only take over-the-counter or prescription medicine as directed by your health care provider.  Keep all follow-up appointments and get all suggested lab tests and ultrasound tests.  Gradually return to normal activities.  Think about joining a support group. Ask for help if you are struggling with grief. This information is not intended to replace advice given to you by your health care provider. Make sure you discuss any questions you have with your health care provider. Document Released: 06/01/2011 Document Revised: 02/19/2016 Document Reviewed: 04/12/2013 Elsevier Interactive Patient Education  2017 [employer]vier Inc.  

## 2018-08-01 NOTE — MAU Note (Signed)
Pt presents with c/o intermittent sharp abdominal pain.  Reports seen @ MAU and pregnancy confirmed with U/S and HCG levels.  Was [facility] since [facility] visit and had another another U/S, U/S showed molar pregnancy.  Reports scheduled for surgery Friday in [place].

## 2018-08-01 NOTE — MAU Provider Note (Signed)
History     CSN: [id number]  Arrival date and time: 08/01/18 1844   First Provider Initiated Contact with Patient 08/01/18 1937      Chief Complaint  Patient presents with  . Abdominal Pain   HPI Wendy Atkins is a 27 y.o. [id number] at [redacted]w[redacted]d who presents with lower abdominal pain. She states she has had pain for several weeks but today the pain became severe. She rates the pain an 8/10 but has not tried anything for the pain. [facility] told she has a molar pregnancy. She is scheduled for surgery in [place] on Friday but came in because the pain was worse.   OB History    Gravida  5   Para  3   Term  3   Preterm      AB  1   Living  3     SAB  1   TAB      Ectopic      Multiple      Live Births              Past Medical History:  Diagnosis Date  . Medical history non-contributory   . Pregnant     Past Surgical History:  Procedure Laterality Date  . NO PAST SURGERIES      Family History  Problem Relation Age of Onset  . Diabetes Mother   . Diabetes Father   . Hypertension Father     Social History   Tobacco Use  . Smoking status: Former Smoker    Packs/day: 0.50    Years: 5.00    Pack years: 2.50    Types: Cigarettes  . Smokeless tobacco: Never Used  Substance Use Topics  . Alcohol use: Yes    Alcohol/week: 2.0 standard drinks    Types: 2 Shots of liquor per week  . Drug use: No    Allergies: No Known Allergies  Medications Prior to Admission  Medication Sig Dispense Refill Last Dose  . cyclobenzaprine (FLEXERIL) 10 MG tablet Take 1 tablet by mouth 3 (three) times daily as needed for muscle spasms.    04/13/2017 at Unknown time    Review of Systems  Constitutional: Negative.  Negative for fatigue and fever.  HENT: Negative.   Respiratory: Negative.  Negative for shortness of breath.   Cardiovascular: Negative.  Negative for chest pain.  Gastrointestinal: Positive for  abdominal pain. Negative for constipation, diarrhea, nausea and vomiting.  Genitourinary: Negative.  Negative for dysuria, vaginal bleeding and vaginal discharge.  Neurological: Negative.  Negative for dizziness and headaches.   Physical Exam   Blood pressure 127/81, pulse 84, temperature 98.4 F (36.9 C), temperature source Oral, resp. rate 20, height 5\' 7"  (1.702 m), weight 115.3 kg, last menstrual period 05/02/2018, SpO2 100 %, unknown if currently breastfeeding.  Physical Exam  Nursing note and vitals reviewed. Constitutional: She is oriented to person, place, and time. She appears well-developed and well-nourished. No distress.  HENT:  Head: Normocephalic.  Eyes: Pupils are equal, round, and reactive to light.  Cardiovascular: Normal rate, regular rhythm and normal heart sounds.  Respiratory: Effort normal and breath sounds normal. No respiratory distress.  GI: Soft. Bowel sounds are normal. She exhibits no distension. There is tenderness in the right lower quadrant, suprapubic area and left lower quadrant. There is no rigidity and no guarding.  Neurological: She is alert and oriented to person, place, and time.  Skin: Skin  is warm and dry.  Psychiatric: She has a normal mood and affect. Her behavior is normal. Judgment and thought content normal.    MAU Course  Procedures Results for orders placed or performed during the hospital encounter of 08/01/18 (from the past 24 hour(s))  Urinalysis, Routine w reflex microscopic     Status: Abnormal   Collection Time: 08/01/18  7:14 PM  Result Value Ref Range   Color, Urine YELLOW YELLOW   APPearance HAZY (A) CLEAR   Specific Gravity, Urine 1.023 1.005 - 1.030   pH 6.0 5.0 - 8.0   Glucose, UA NEGATIVE NEGATIVE mg/dL   Hgb urine dipstick NEGATIVE NEGATIVE   Bilirubin Urine NEGATIVE NEGATIVE   Ketones, ur NEGATIVE NEGATIVE mg/dL   Protein, ur NEGATIVE NEGATIVE mg/dL   Nitrite NEGATIVE NEGATIVE   Leukocytes, UA SMALL (A) NEGATIVE    RBC / HPF 0-5 0 - 5 RBC/hpf   WBC, UA 0-5 0 - 5 WBC/hpf   Bacteria, UA RARE (A) NONE SEEN   Squamous Epithelial / LPF 0-5 0 - 5   Mucus PRESENT   hCG, quantitative, pregnancy     Status: Abnormal   Collection Time: 08/01/18  7:56 PM  Result Value Ref Range   hCG, Beta Chain, Quant, S 106,082 (H) <5 mIU/mL  CBC     Status: None   Collection Time: 08/01/18  7:56 PM  Result Value Ref Range   WBC 6.1 4.0 - 10.5 K/uL   RBC 4.06 3.87 - 5.11 MIL/uL   Hemoglobin 12.0 12.0 - 15.0 g/dL   HCT 16.1 09.6 - 04.5 %   MCV 89.7 80.0 - 100.0 fL   MCH 29.6 26.0 - 34.0 pg   MCHC 33.0 30.0 - 36.0 g/dL   RDW 40.9 81.1 - 91.4 %   Platelets 316 150 - 400 K/uL   nRBC 0.0 0.0 - 0.2 %   US Ob Transvaginal  Result Date: 08/01/2018 CLINICAL DATA:  Recent diagnosis of molar pregnancy. EXAM: OBSTETRIC <14 WK [place] AND TRANSVAGINAL OB US DOPPLER ULTRASOUND OF OVARIES TECHNIQUE: Both transabdominal and transvaginal ultrasound examinations were performed for complete evaluation of the gestation as well as the maternal uterus, adnexal regions, and pelvic cul-de-sac. Transvaginal technique was performed to assess early pregnancy. Color and duplex Doppler ultrasound was utilized to evaluate blood flow to the ovaries. COMPARISON:  06/27/2018 FINDINGS: There is a conglomeration of disorganized tissue within the endometrial canal showing echogenic material, cystic spaces and blood flow, consistent with the clinical diagnosis of molar pregnancy. There is nothing that resembles a normal appearing gestational sac or developing fetus. 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 Right ovary is normal measuring 2.6 x 1.9 x 1.7 cm. Left ovary is normal measuring 3.2 x 2.3 x 2.7 cm. No free fluid. IMPRESSION: Findings consistent with the clinical diagnosis of molar pregnancy. See above discussion. Electronically Signed   By: Paulina Fusi M.D.   On: 08/01/2018 20:59   MDM UA CBC, HCG US  OB Transvaginal Diluadid 1mg  IM  Consulted with Dr. Adrian Blackwater regarding results- will send message to get patient scheduled for surgery at [facility] hospital. [facility] to discharge patient with limited number of pain medication.  Assessment and Plan   1. Molar pregnancy   2. Abdominal pain affecting pregnancy    -Discharge home -Rx for limited number of ultram sent to patient's pharmacy -Abdominal pain precautions discussed -[facility] -Patient may return to MAU as needed or if her condition were to change or worsen  Rolm Bookbinder CNM 08/01/2018, 9:02 PM

## 2018-08-02 ENCOUNTER — Other Ambulatory Visit: Payer: Self-pay

## 2018-08-02 ENCOUNTER — Telehealth (HOSPITAL_COMMUNITY): Payer: Self-pay

## 2018-08-02 ENCOUNTER — Encounter (HOSPITAL_COMMUNITY): Payer: Self-pay | Admitting: *Deleted

## 2018-08-02 NOTE — Telephone Encounter (Signed)
-----   Message from Rolm Bookbinder, [place] sent at 08/01/2018  9:14 PM EST ----- Regarding: Schedule for surgery Hi Wendy Atkins,  This patient needs to be scheduled for a D&C for a removal of a molar pregnancy.  Thank you!

## 2018-08-02 NOTE — Telephone Encounter (Signed)
Called Wendy Atkins, spoke with her, surgery date and time and pre-op instructions given.

## 2018-08-04 ENCOUNTER — Encounter (HOSPITAL_COMMUNITY)
Admission: RE | Disposition: A | Discharge status: Home / Self Care | Payer: Self-pay | Source: Ambulatory Visit | Attending: Obstetrics and Gynecology

## 2018-08-04 ENCOUNTER — Other Ambulatory Visit: Payer: Self-pay

## 2018-08-04 ENCOUNTER — Ambulatory Visit (HOSPITAL_COMMUNITY): Payer: Medicaid Other | Admitting: Anesthesiology

## 2018-08-04 ENCOUNTER — Ambulatory Visit (HOSPITAL_COMMUNITY)
Admission: RE | Admit: 2018-08-04 | Discharge: 2018-08-04 | Disposition: A | Discharge status: Home / Self Care | Payer: Medicaid Other | Source: Ambulatory Visit | Attending: Obstetrics and Gynecology | Admitting: Obstetrics and Gynecology

## 2018-08-04 ENCOUNTER — Ambulatory Visit (HOSPITAL_COMMUNITY): Payer: Medicaid Other

## 2018-08-04 ENCOUNTER — Encounter (HOSPITAL_COMMUNITY): Payer: Self-pay

## 2018-08-04 DIAGNOSIS — Z6839 Body mass index (BMI) 39.0-39.9, adult: Secondary | ICD-10-CM | POA: Insufficient documentation

## 2018-08-04 DIAGNOSIS — K219 Gastro-esophageal reflux disease without esophagitis: Secondary | ICD-10-CM | POA: Diagnosis not present

## 2018-08-04 DIAGNOSIS — O020 Blighted ovum and nonhydatidiform mole: Secondary | ICD-10-CM | POA: Diagnosis not present

## 2018-08-04 DIAGNOSIS — Z87891 Personal history of nicotine dependence: Secondary | ICD-10-CM | POA: Insufficient documentation

## 2018-08-04 HISTORY — PX: DILATION AND EVACUATION: SHX1459

## 2018-08-04 LAB — COMPREHENSIVE METABOLIC PANEL
ALK PHOS: 76 U/L (ref 38–126)
ALT: 10 U/L (ref 0–44)
AST: 14 U/L — AB (ref 15–41)
Albumin: 3.7 g/dL (ref 3.5–5.0)
Anion gap: 7 (ref 5–15)
BILIRUBIN TOTAL: 0.8 mg/dL (ref 0.3–1.2)
BUN: 9 mg/dL (ref 6–20)
CALCIUM: 9 mg/dL (ref 8.9–10.3)
CO2: 22 mmol/L (ref 22–32)
Chloride: 105 mmol/L (ref 98–111)
Creatinine, Ser: 0.62 mg/dL (ref 0.44–1.00)
GFR calc Af Amer: >60 mL/min (ref >60)
GFR calc non Af Amer: >60 mL/min (ref >60)
GLUCOSE: 87 mg/dL (ref 70–99)
Potassium: 3.8 mmol/L (ref 3.5–5.1)
Sodium: 134 mmol/L — ABNORMAL LOW (ref 135–145)
TOTAL PROTEIN: 7.9 g/dL (ref 6.5–8.1)

## 2018-08-04 LAB — TYPE AND SCREEN
ABO/RH(D): B POS
Antibody Screen: NEGATIVE

## 2018-08-04 LAB — CBC
HEMATOCRIT: 37.3 % (ref 36.0–46.0)
HEMOGLOBIN: 12.3 g/dL (ref 12.0–15.0)
MCH: 29.4 pg (ref 26.0–34.0)
MCHC: 33 g/dL (ref 30.0–36.0)
MCV: 89.2 fL (ref 80.0–100.0)
NRBC: 0 % (ref 0.0–0.2)
Platelets: 312 10*3/uL (ref 150–400)
RBC: 4.18 MIL/uL (ref 3.87–5.11)
RDW: 12.9 % (ref 11.5–15.5)
WBC: 5.5 10*3/uL (ref 4.0–10.5)

## 2018-08-04 SURGERY — DILATION AND EVACUATION, UTERUS
Anesthesia: General

## 2018-08-04 MED ORDER — FENTANYL CITRATE (PF) 100 MCG/2ML IJ SOLN
INTRAMUSCULAR | Status: AC
  Filled 2018-08-04: qty 2

## 2018-08-04 MED ORDER — MIDAZOLAM HCL 2 MG/2ML IJ SOLN
INTRAMUSCULAR | Status: AC
  Filled 2018-08-04: qty 2

## 2018-08-04 MED ORDER — FENTANYL CITRATE (PF) 100 MCG/2ML IJ SOLN
INTRAMUSCULAR | Status: DC | PRN
  Administered 2018-08-04 (×2): 25 ug via INTRAVENOUS
  Administered 2018-08-04: 50 ug via INTRAVENOUS

## 2018-08-04 MED ORDER — MIDAZOLAM HCL 2 MG/2ML IJ SOLN
INTRAMUSCULAR | Status: DC | PRN
  Administered 2018-08-04: 2 mg via INTRAVENOUS

## 2018-08-04 MED ORDER — PROPOFOL 10 MG/ML IV BOLUS
INTRAVENOUS | Status: DC | PRN
  Administered 2018-08-04: 200 mg via INTRAVENOUS

## 2018-08-04 MED ORDER — IBUPROFEN 600 MG PO TABS: 600.0000 mg | ORAL_TABLET | Freq: Four times a day (QID) | ORAL | 0 refills | Status: DC | PRN

## 2018-08-04 MED ORDER — FENTANYL CITRATE (PF) 100 MCG/2ML IJ SOLN: 25.0000 ug | INTRAMUSCULAR | Status: DC | PRN

## 2018-08-04 MED ORDER — SCOPOLAMINE 1 MG/3DAYS TD PT72
1.0000 | MEDICATED_PATCH | Freq: Once | TRANSDERMAL | Status: DC
  Administered 2018-08-04: 1.5 mg via TRANSDERMAL

## 2018-08-04 MED ORDER — LACTATED RINGERS IV SOLN
INTRAVENOUS | Status: DC
  Administered 2018-08-04: 125 mL/h via INTRAVENOUS

## 2018-08-04 MED ORDER — ONDANSETRON HCL 4 MG/2ML IJ SOLN
INTRAMUSCULAR | Status: DC | PRN
  Administered 2018-08-04: 4 mg via INTRAVENOUS

## 2018-08-04 MED ORDER — LACTATED RINGERS IV SOLN: INTRAVENOUS | Status: DC

## 2018-08-04 MED ORDER — SCOPOLAMINE 1 MG/3DAYS TD PT72
MEDICATED_PATCH | TRANSDERMAL | Status: AC
  Administered 2018-08-04: 1.5 mg via TRANSDERMAL
  Filled 2018-08-04: qty 1

## 2018-08-04 MED ORDER — HYDROCODONE-ACETAMINOPHEN 7.5-325 MG PO TABS: 1.0000 | ORAL_TABLET | Freq: Once | ORAL | Status: DC | PRN

## 2018-08-04 MED ORDER — MEPERIDINE HCL 25 MG/ML IJ SOLN: 6.2500 mg | INTRAMUSCULAR | Status: DC | PRN

## 2018-08-04 MED ORDER — DOXYCYCLINE HYCLATE 100 MG IV SOLR
200.0000 mg | INTRAVENOUS | Status: DC
  Filled 2018-08-04: qty 200

## 2018-08-04 MED ORDER — HYDROCODONE-ACETAMINOPHEN 5-325 MG PO TABS: 1.0000 | ORAL_TABLET | Freq: Four times a day (QID) | ORAL | 0 refills | Status: DC | PRN

## 2018-08-04 MED ORDER — SODIUM CHLORIDE 0.9 % IV SOLN
INTRAVENOUS | Status: DC | PRN
  Administered 2018-08-04: 200 mg via INTRAVENOUS

## 2018-08-04 MED ORDER — LIDOCAINE HCL (CARDIAC) PF 100 MG/5ML IV SOSY
PREFILLED_SYRINGE | INTRAVENOUS | Status: DC | PRN
  Administered 2018-08-04: 100 mg via INTRAVENOUS

## 2018-08-04 MED ORDER — ONDANSETRON HCL 4 MG/2ML IJ SOLN: 4.0000 mg | Freq: Once | INTRAMUSCULAR | Status: DC | PRN

## 2018-08-04 MED ORDER — DEXAMETHASONE SODIUM PHOSPHATE 10 MG/ML IJ SOLN
INTRAMUSCULAR | Status: DC | PRN
  Administered 2018-08-04: 4 mg via INTRAVENOUS

## 2018-08-04 SURGICAL SUPPLY — 19 items
CATH ROBINSON RED A/P 16FR (CATHETERS) ×3 IMPLANT
DECANTER SPIKE VIAL GLASS SM (MISCELLANEOUS) ×3 IMPLANT
GLOVE BIOGEL PI IND STRL 6.5 (GLOVE) ×1 IMPLANT
GLOVE BIOGEL PI IND STRL 7.0 (GLOVE) ×1 IMPLANT
GLOVE BIOGEL PI INDICATOR 6.5 (GLOVE) ×2
GLOVE BIOGEL PI INDICATOR 7.0 (GLOVE) ×2
GLOVE ORTHOPEDIC STR SZ6.5 (GLOVE) ×3 IMPLANT
GOWN STRL REUS W/TWL LRG LVL3 (GOWN DISPOSABLE) ×6 IMPLANT
KIT BERKELEY 1ST TRIMESTER 3/8 (MISCELLANEOUS) ×3 IMPLANT
NS IRRIG 1000ML POUR BTL (IV SOLUTION) ×3 IMPLANT
PACK VAGINAL MINOR WOMEN LF (CUSTOM PROCEDURE TRAY) ×3 IMPLANT
PAD OB MATERNITY 4.3X12.25 (PERSONAL CARE ITEMS) ×3 IMPLANT
PAD PREP 24X48 CUFFED NSTRL (MISCELLANEOUS) ×3 IMPLANT
SET BERKELEY SUCTION TUBING (SUCTIONS) ×3 IMPLANT
TOWEL OR 17X24 6PK STRL BLUE (TOWEL DISPOSABLE) ×6 IMPLANT
VACURETTE 10 RIGID CVD (CANNULA) IMPLANT
VACURETTE 7MM CVD STRL WRAP (CANNULA) IMPLANT
VACURETTE 8 RIGID CVD (CANNULA) IMPLANT
VACURETTE 9 RIGID CVD (CANNULA) IMPLANT

## 2018-08-04 NOTE — Op Note (Signed)
Wendy Atkins PROCEDURE DATE:  08/04/2018  PREOPERATIVE DIAGNOSIS: molar pregnancy POSTOPERATIVE DIAGNOSIS: The same PROCEDURE: suction dilation and curettage under ultrasound guidance SURGEON:  Dr. Baldemar Lenis  INDICATIONS: 27 y.o.  [id number] presenting with bleeding s/p molar pregnancy, recommended for surgical management.  Risks of surgery were discussed with the patient including but not limited to: bleeding which may require transfusion; infection which may require antibiotics; injury to uterus or surrounding organs; need for additional procedures including laparotomy or laparoscopy; possibility of intrauterine scarring which may impair future fertility; and other postoperative/anesthesia complications. Written informed consent was obtained.  ABO, Rh: --/--/B POS (11/08 1048) CBC    Component Value Date/Time   WBC 5.5 08/04/2018 1048   RBC 4.18 08/04/2018 1048   HGB 12.3 08/04/2018 1048   HCT 37.3 08/04/2018 1048   PLT 312 08/04/2018 1048   MCV 89.2 08/04/2018 1048   MCH 29.4 08/04/2018 1048   MCHC 33.0 08/04/2018 1048   RDW 12.9 08/04/2018 1048     FINDINGS:   Significant amount of products of conception within uterus. Empty endometrial stripe noted on ultrasound at the end of the procedure.   ANESTHESIA:   General anesthesia INTRAVENOUS FLUIDS:  700 mL of LR ESTIMATED BLOOD LOSS:  25 mL URINE OUTPUT: 50 mL clear yellow urine SPECIMENS:  Products of conception sent to pathology COMPLICATIONS:  None immediate.  PROCEDURE DETAILS:  The patient received intravenous Doxycycline while in the preoperative area.  She was then taken to the operating room where anesthesia was administered and was found to be adequate.  After an adequate timeout was performed, she was placed in the dorsal lithotomy position and examined; then prepped and draped in the sterile manner.  Her bladder was catheterized for return of clear, yellow urine. A vaginal speculum was then placed in the patient's vagina  and a single tooth tenaculum was applied to the anterior lip of the cervix.  The cervix was gently dilated to accommodate a 8 mm suction curette. The suction curette was gently advanced to the uterine fundus and activated. The curette was slowly rotated to clear the uterus of products of conception.  This was repeated until the endometrial cavity was cleared and a clean stripe was noted on ultrasound. A sharp curettage was then performed to confirm complete emptying of the uterus. The suction curette was advanced to the fundus and activated one last time under ultrasound guidance and removed and the procedure was finished. There was an empty endometrial stripe noted on the ultrasound at the end of the curettage. There was minimal bleeding noted at the end of the procedure, and the tenaculum removed with good hemostasis noted at the tenaculum site with application of pressure.  All instruments were removed from the patient's vagina.  Sponge and instrument counts were correct times three.  The patient tolerated the procedure well and was taken to the recovery area awake, extubated and in stable condition.  The patient will be discharged to home as per PACU criteria.  Routine postoperative instructions given.  She was prescribed Norco, Ibuprofen.  She will follow up in the clinic in 2-3 weeks for postoperative evaluation.   Baldemar Lenis, M.D. [facility]

## 2018-08-04 NOTE — Anesthesia Preprocedure Evaluation (Signed)
Anesthesia Evaluation  Patient identified by MRN, date of birth, ID band Patient awake    Airway Mallampati: III  TM Distance: >3 FB Neck ROM: Full    Dental  (+) Teeth Intact Orthodontic appliances:   Pulmonary former smoker,    Pulmonary exam normal breath sounds clear to auscultation       Cardiovascular Normal cardiovascular exam Rhythm:Regular Rate:Normal     Neuro/Psych negative neurological ROS  negative psych ROS   GI/Hepatic Neg liver ROS, GERD  Medicated,  Endo/Other  Morbid obesity  Renal/GU negative Renal ROS  negative genitourinary   Musculoskeletal negative musculoskeletal ROS (+)   Abdominal (+) + obese,   Peds  Hematology   Anesthesia Other Findings   Reproductive/Obstetrics (+) Pregnancy Molar pregnancy 7 weeks                             Anesthesia Physical Anesthesia Plan  ASA: III  Anesthesia Plan: General   Post-op Pain Management:    Induction: Intravenous  PONV Risk Score and Plan: Midazolam, Dexamethasone, Treatment may vary due to age or medical condition, Ondansetron and Scopolamine patch - Pre-op  Airway Management Planned: LMA  Additional Equipment:   Intra-op Plan:   Post-operative Plan: Extubation in OR  Informed Consent: I have reviewed the patients History and Physical, chart, labs and discussed the procedure including the risks, benefits and alternatives for the proposed anesthesia with the patient or authorized representative who has indicated his/her understanding and acceptance.   Dental advisory given  Plan Discussed with: CRNA and Surgeon  Anesthesia Plan Comments:         Anesthesia Quick Evaluation

## 2018-08-04 NOTE — H&P (Signed)
OB/GYN History and Physical  Wendy Atkins is a 27 y.o. [id number] presenting for suction D&C for suspected molar pregnancy.  Last pregnancy was term SVD 06/2017.      Past Medical History:  Diagnosis Date  . Medical history non-contributory   . Pregnant   . SVD (spontaneous vaginal delivery)    x 3    Past Surgical History:  Procedure Laterality Date  . NO PAST SURGERIES      OB History  Gravida Para Term Preterm AB Living  5 3 3   1 3   SAB TAB Ectopic Multiple Live Births  1            # Outcome Date GA Lbr Len/2nd Weight Sex Delivery Anes PTL Lv  5 Current           4 Term           3 Term           2 Term           1 SAB             Social History   Socioeconomic History  . Marital status: Single    Spouse name: Not on file  . Number of children: 3  . Years of education: Not on file  . Highest education level: Not on file  Occupational History  . Not on file  Social Needs  . Financial resource strain: Not on file  . Food insecurity:    Worry: Not on file    Inability: Not on file  . Transportation needs:    Medical: Not on file    Non-medical: Not on file  Tobacco Use  . Smoking status: Former Smoker    Packs/day: 0.50    Years: 5.00    Pack years: 2.50    Types: Cigarettes    Last attempt to quit: 06/11/2018    Years since quitting: 0.1  . Smokeless tobacco: Never Used  Substance and Sexual Activity  . Alcohol use: Yes    Alcohol/week: 2.0 standard drinks    Types: 2 Shots of liquor per week  . Drug use: No  . Sexual activity: Yes    Comment: approx [redacted] wks gestation  Lifestyle  . Physical activity:    Days per week: Not on file    Minutes per session: Not on file  . Stress: Not on file  Relationships  . Social connections:    Talks on phone: Not on file    Gets together: Not on file    Attends religious service: Not on file    Active member of club or organization: Not on file    Attends meetings of clubs or organizations: Not on file   Relationship status: Not on file  Other Topics Concern  . Not on file  Social History Narrative  . Not on file    Family History  Problem Relation Age of Onset  . Diabetes Mother   . Diabetes Father   . Hypertension Father     Medications Prior to Admission  Medication Sig Dispense Refill Last Dose  . traMADol (ULTRAM) 50 MG tablet Take 1 tablet (50 mg total) by mouth every 6 (six) hours as needed for up to 3 days. 12 tablet 0 08/03/2018 at Unknown time    Allergies  Allergen Reactions  . Ampicillin Itching    Itching arm while antibiotic infusing Has patient had a PCN reaction causing immediate rash, facial/tongue/throat swelling, SOB or lightheadedness  with hypotension: Yes Has patient had a PCN reaction causing severe rash involving mucus membranes or skin necrosis: No Has patient had a PCN reaction that required hospitalization: No Has patient had a PCN reaction occurring within the last 10 years: Yes If all of the above answers are "NO", then may proceed with Cephalosporin use.     Review of Systems: Negative except for what is mentioned in HPI.     Physical Exam: Ht 5\' 7"  (1.702 m)   Wt 115.7 kg   LMP 05/02/2018 (Approximate)   BMI 39.94 kg/m  CONSTITUTIONAL: Well-developed, well-nourished female in no acute distress.  HENT:  Normocephalic, atraumatic, External right and left ear normal. Oropharynx is clear and moist EYES: Conjunctivae and EOM are normal. Pupils are equal, round, and reactive to light. No scleral icterus.  NECK: Normal range of motion, supple, no masses SKIN: Skin is warm and dry. No rash noted. Not diaphoretic. No erythema. No pallor. NEUROLGIC: Alert and oriented to person, place, and time. Normal reflexes, muscle tone coordination. No cranial nerve deficit noted. PSYCHIATRIC: Normal mood and affect. Normal behavior. Normal judgment and thought content. CARDIOVASCULAR: Normal heart rate noted, regular rhythm RESPIRATORY: Effort and breath  sounds normal, no problems with respiration noted ABDOMEN: Soft, nontender, nondistended, gravid.  PELVIC: Deferred MUSCULOSKELETAL: Normal range of motion. No edema and no tenderness. 2+ distal pulses.   Pertinent Labs/Studies:   Results for orders placed or performed during the hospital encounter of 08/01/18 (from the past 72 hour(s))  Urinalysis, Routine w reflex microscopic     Status: Abnormal   Collection Time: 08/01/18  7:14 PM  Result Value Ref Range   Color, Urine YELLOW YELLOW   APPearance HAZY (A) CLEAR   Specific Gravity, Urine 1.023 1.005 - 1.030   pH 6.0 5.0 - 8.0   Glucose, UA NEGATIVE NEGATIVE mg/dL   Hgb urine dipstick NEGATIVE NEGATIVE   Bilirubin Urine NEGATIVE NEGATIVE   Ketones, ur NEGATIVE NEGATIVE mg/dL   Protein, ur NEGATIVE NEGATIVE mg/dL   Nitrite NEGATIVE NEGATIVE   Leukocytes, UA SMALL (A) NEGATIVE   RBC / HPF 0-5 0 - 5 RBC/hpf   WBC, UA 0-5 0 - 5 WBC/hpf   Bacteria, UA RARE (A) NONE SEEN   Squamous Epithelial / LPF 0-5 0 - 5   Mucus PRESENT     Comment: Performed at [facility], [address], [place], [place] [address]  hCG, quantitative, pregnancy     Status: Abnormal   Collection Time: 08/01/18  7:56 PM  Result Value Ref Range   hCG, Beta Chain, Quant, S 106,082 (H) <5 mIU/mL    Comment:          GEST. AGE      CONC.  (mIU/mL)   <=1 WEEK        5 - 50     2 WEEKS       50 - 500     3 WEEKS       100 - 10,000     4 WEEKS     1,000 - 30,000     5 WEEKS     3,500 - 115,000   6-8 WEEKS     12,000 - 270,000    12 WEEKS     15,000 - 220,000        FEMALE AND NON-PREGNANT FEMALE:     LESS THAN 5 mIU/mL Performed at [facility], [address] [place], [place] [address]   CBC  Status: None   Collection Time: 08/01/18  7:56 PM  Result Value Ref Range   WBC 6.1 4.0 - 10.5 K/uL   RBC 4.06 3.87 - 5.11 MIL/uL   Hemoglobin 12.0 12.0 - 15.0 g/dL   HCT 16.1 09.6 - 04.5 %   MCV 89.7 80.0 - 100.0 fL   MCH 29.6 26.0 - 34.0 pg   MCHC  33.0 30.0 - 36.0 g/dL   RDW 40.9 81.1 - 91.4 %   Platelets 316 150 - 400 K/uL   nRBC 0.0 0.0 - 0.2 %    Comment: Performed at [facility], [address], [place], [place] [address]   CLINICAL DATA:  Recent diagnosis of molar pregnancy.  EXAM: OBSTETRIC <14 WK [place] AND TRANSVAGINAL OB  US DOPPLER ULTRASOUND OF OVARIES  TECHNIQUE: Both transabdominal and transvaginal ultrasound examinations were performed for complete evaluation of the gestation as well as the maternal uterus, adnexal regions, and pelvic cul-de-sac. Transvaginal technique was performed to assess early pregnancy.  Color and duplex Doppler ultrasound was utilized to evaluate blood flow to the ovaries.  COMPARISON:  06/27/2018  FINDINGS: There is a conglomeration of disorganized tissue within the endometrial canal showing echogenic material, cystic spaces and blood flow, consistent with the clinical diagnosis of molar pregnancy. There is nothing that resembles a normal appearing gestational sac or developing fetus.  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  Right ovary is normal measuring 2.6 x 1.9 x 1.7 cm. Left ovary is normal measuring 3.2 x 2.3 x 2.7 cm. No free fluid.  IMPRESSION: Findings consistent with the clinical diagnosis of molar pregnancy. See above discussion.   Electronically Signed   By: Paulina Fusi M.D.   On: 08/01/2018 20:59     Assessment and Plan :Wendy Atkins is a 27 y.o. [id number] admitted for surgical management of suspected molar pregnancy. Reviewed recommendation for surgical management of suspected molar pregnancy to confirm removal of tissue and confirm diagnosis based on pathology. The risks of suction dilation and evacuation, curettage were reviewed with the patient; including but not limited to: infection which may require antibiotics; bleeding which may require transfusion or re-operation; injury to bowel,  bladder, ureters or other surrounding organs; need for additional procedures including hysterectomy in the event of a life-threatening hemorrhage; risks of anesthesia and other post-operative complications. Reviewed that all sampling will be sent to pathology and further management based on findings. Reviewed need for close follow up including blood draws. Reviewed risk of gestational trophoblastic neoplasia and possibility of need for chemotherapy or further management based on findings. Patient verbalizes understanding and is agreeable to close follow up. The patient concurred with the proposed plan, giving informed consent for the procedure. She is agreeable to a blood transfusion in the event of emergency.  Patient is NPO Anesthesia aware Preoperative prophylactic antibiotics ordered SCDs  Admission labs To OR when ready   K. Therese Sarah, M.D. Attending Obstetrician & Gynecologist, [facility] for [employer], [facility]

## 2018-08-04 NOTE — Discharge Instructions (Signed)
DISCHARGE INSTRUCTIONS: D&C / D&E The following instructions have been prepared to help you care for yourself upon your return home.   Personal hygiene:  Use sanitary pads for vaginal drainage, not tampons.  Shower the day after your procedure.  NO tub baths, pools or Jacuzzis for 2-3 weeks.  Wipe front to back after using the bathroom.  Activity and limitations:  Do NOT drive or operate any equipment for 24 hours. The effects of anesthesia are still present and drowsiness may result.  Do NOT rest in bed all day.  Walking is encouraged.  Walk up and down stairs slowly.  You may resume your normal activity in one to two days or as indicated by your physician.  Sexual activity: NO intercourse for at least 2 weeks after the procedure, or as indicated by your physician.  Diet: Eat a light meal as desired this evening. You may resume your usual diet tomorrow.  Return to work: You may resume your work activities in one to two days or as indicated by your doctor.  What to expect after your surgery: Expect to have vaginal bleeding/discharge for 2-3 days and spotting for up to 10 days. It is not unusual to have soreness for up to 1-2 weeks. You may have a slight burning sensation when you urinate for the first day. Mild cramps may continue for a couple of days. You may have a regular period in 2-6 weeks.  Call your doctor for any of the following:  Excessive vaginal bleeding, saturating and changing one pad every hour.  Inability to urinate 6 hours after discharge from hospital.  Pain not relieved by pain medication.  Fever of 100.4 F or greater.  Unusual vaginal discharge or odor.   Call for an appointment:    Patients signature: ______________________  Nurses signature ________________________  Support person's signature_______________________     Dilation and Curettage or Vacuum Curettage, Care After These instructions give you information about caring for  yourself after your procedure. Your doctor may also give you more specific instructions. Call your doctor if you have any problems or questions after your procedure. Follow these instructions at home: Activity  Do not drive or use heavy machinery while taking prescription pain medicine.  For 24 hours after your procedure, avoid driving.  Take short walks often, followed by rest periods. Ask your doctor what activities are safe for you. After one or two days, you may be able to return to your normal activities.  Do not lift anything that is heavier than 10 lb (4.5 kg) until your doctor approves.  For at least 2 weeks, or as long as told by your doctor: ? Do not douche. ? Do not use tampons. ? Do not have sex. General instructions  Take over-the-counter and prescription medicines only as told by your doctor. This is very important if you take blood thinning medicine.  Do not take baths, swim, or use a hot tub until your doctor approves. Take showers instead of baths.  Wear compression stockings as told by your doctor.  It is up to you to get the results of your procedure. Ask your doctor when your results will be ready.  Keep all follow-up visits as told by your doctor. This is important. Contact a doctor if:  You have very bad cramps that get worse or do not get better with medicine.  You have very bad pain in your belly (abdomen).  You cannot drink fluids without throwing up (vomiting).  You get pain  in a different part of the area between your belly and thighs (pelvis).  You have bad-smelling discharge from your vagina.  You have a rash. Get help right away if:  You are bleeding a lot from your vagina. A lot of bleeding means soaking more than one sanitary pad in an hour, for 2 hours in a row.  You have clumps of blood (blood clots) coming from your vagina.  You have a fever or chills.  Your belly feels very tender or hard.  You have chest pain.  You have trouble  breathing.  You cough up blood.  You feel dizzy.  You feel light-headed.  You pass out (faint).  You have pain in your neck or shoulder area. Summary  Take short walks often, followed by rest periods. Ask your doctor what activities are safe for you. After one or two days, you may be able to return to your normal activities.  Do not lift anything that is heavier than 10 lb (4.5 kg) until your doctor approves.  Do not take baths, swim, or use a hot tub until your doctor approves. Take showers instead of baths.  Contact your doctor if you have any symptoms of infection, like bad-smelling discharge from your vagina. This information is not intended to replace advice given to you by your health care provider. Make sure you discuss any questions you have with your health care provider. Document Released: 06/22/2008 Document Revised: 05/31/2016 Document Reviewed: 05/31/2016 Elsevier Interactive Patient Education  2017 Elsevier Inc. Molar Pregnancy A molar pregnancy (hydatidiform mole) is a mass of tissue that grows in the uterus after conception. The mass is created by an egg that was not fertilized correctly and abnormally grows. It is an abnormal pregnancy and does not develop into a fetus. If a molar pregnancy is suspected by your health care provider, treatment is required. What are the causes? Molar pregnancy is caused by an egg that is fertilized incorrectly so that it has abnormal genetic material (chromosomes). This can result in one of 2 types of molar pregnancy:  Complete molar pregnancy--All of the chromosomes in the fertilized egg come from the father; none come from the mother.  Partial molar pregnancy--The fertilized egg has chromosomes from the father and mother, but it has too many chromosomes.  What increases the risk? Certain risk factors make a molar pregnancy more likely. They include:  Being over age 72 or under age 26.  History of a molar pregnancy in the past  (extremely small chance of recurrence).  Other possible risk factors include:  Smoking more than 15 cigarettes per day.  History of infertility.  Having a certain blood type (A, B, AB).  Having a vitamin A deficiency.  Using oral contraceptives.  What are the signs or symptoms?  Vaginal bleeding.  Missed menstrual period.  Uterus grows quicker than normal.  Severe nausea and vomiting.  Severe pressure or pain in the uterus.  Abnormal ovarian cysts (theca lutein cysts).  Discharge from the vagina that looks like grapes.  High blood pressure (early onset of preeclampsia).  Overactive thyroid (hyperthyroidism).  Anemia. How is this diagnosed? If your health care provider thinks there is a chance of a molar pregnancy, testing will be recommended. Possible tests include:  An ultrasound test.  Blood tests.  How is this treated? Most molar pregnancies end on their own by miscarriage. However, a health care provider needs to make sure that all the abnormal tissue is out of the womb. This can  be done with dilation and curettage (D&C) or suction curettage. In this procedure, any remaining molar tissue is removed through the vagina. After diagnosis of a molar pregnancy, the pregnancy hormone levels must be followed until the level is zero. If the pregnancy hormone level does not drop appropriately, chemotherapy may be necessary. Also, you will be given a medicine called Rho (D) immune globulin if you are Rh negative and your sex partner is Rh positive. This helps prevent Rh problems in future pregnancies. Follow these instructions at home:  Avoid getting pregnant for 6-12 months or as directed by your health care provider. Use a reliable form of birth control or do not have sex.  Only take over-the-counter or prescription medicine as directed by your health care provider.  Keep all follow-up appointments and get all suggested lab tests and ultrasound tests.  Gradually return  to normal activities.  Think about joining a support group. Ask for help if you are struggling with grief. This information is not intended to replace advice given to you by your health care provider. Make sure you discuss any questions you have with your health care provider. Document Released: 06/01/2011 Document Revised: 02/19/2016 Document Reviewed: 04/12/2013 Elsevier Interactive Patient Education  2017 [employer].

## 2018-08-04 NOTE — Anesthesia Procedure Notes (Signed)
Procedure Name: LMA Insertion Date/Time: 08/04/2018 12:06 PM Performed by: Armanda Heritage, CRNA Pre-anesthesia Checklist: Patient identified, Emergency Drugs available, Suction available, Patient being monitored and Timeout performed Patient Re-evaluated:Patient Re-evaluated prior to induction Oxygen Delivery Method: Circle system utilized Preoxygenation: Pre-oxygenation with 100% oxygen Induction Type: IV induction LMA: LMA inserted LMA Size: 4.0 Tube size: 4.0 mm Number of attempts: 1 Placement Confirmation: positive ETCO2 and breath sounds checked- equal and bilateral ETT to lip (cm): at lips. Dental Injury: Teeth and Oropharynx as per pre-operative assessment

## 2018-08-04 NOTE — Anesthesia Postprocedure Evaluation (Signed)
Anesthesia Post Note  Patient: Barbar Fricke  Procedure(s) Performed: DILATATION AND EVACUATION - Ultrasound Guided (N/A )     Patient location during evaluation: PACU Anesthesia Type: General Level of consciousness: awake and alert and oriented Pain management: pain level controlled Vital Signs Assessment: post-procedure vital signs reviewed and stable Respiratory status: spontaneous breathing, nonlabored ventilation and respiratory function stable Cardiovascular status: blood pressure returned to baseline and stable Postop Assessment: no apparent nausea or vomiting Anesthetic complications: no    Last Vitals:  Vitals:   08/04/18 1247 08/04/18 1320  BP:    Pulse:  62  Resp:  19  Temp: 36.9 C 37 C  SpO2:  100%    Last Pain:  Vitals:   08/04/18 1320  TempSrc: Oral  PainSc:    Pain Goal: Patients Stated Pain Goal: 5 (08/04/18 1042)               Kristopher Delk A.

## 2018-08-04 NOTE — Transfer of Care (Signed)
Immediate Anesthesia Transfer of Care Note  Patient: Wendy Atkins  Procedure(s) Performed: DILATATION AND EVACUATION - Ultrasound Guided (N/A )  Patient Location: PACU  Anesthesia Type:General  Level of Consciousness: awake, alert  and oriented  Airway & Oxygen Therapy: Patient Spontanous Breathing and Patient connected to nasal cannula oxygen  Post-op Assessment: Report given to RN and Post -op Vital signs reviewed and stable  Post vital signs: Reviewed and stable  Last Vitals:  Vitals Value Taken Time  BP    Temp    Pulse    Resp    SpO2      Last Pain:  Vitals:   08/04/18 1042  TempSrc: Oral  PainSc: 0-No pain      Patients Stated Pain Goal: 5 (08/04/18 1042)  Complications: No apparent anesthesia complications

## 2018-08-05 ENCOUNTER — Encounter (HOSPITAL_COMMUNITY): Payer: Self-pay | Admitting: Obstetrics and Gynecology

## 2018-08-08 ENCOUNTER — Telehealth: Payer: Self-pay | Admitting: Obstetrics and Gynecology

## 2018-08-08 NOTE — Telephone Encounter (Signed)
Called patient to review pathology of partial mole with her, reviewed importance of presenting for weekly lab draws for the next month. Will have office call her to schedule these lab draws. Reviewed risk of GTN and that monitoring serum HCG is standard surveillance, she verbalizes understanding and importance of following HCG. She will present for lab draw.  Baldemar Lenis, M.D. Center for [employer]

## 2018-08-14 ENCOUNTER — Other Ambulatory Visit: Payer: Self-pay | Admitting: *Deleted

## 2018-08-14 DIAGNOSIS — O020 Blighted ovum and nonhydatidiform mole: Secondary | ICD-10-CM

## 2018-08-14 NOTE — Progress Notes (Signed)
bhcg

## 2018-08-16 ENCOUNTER — Other Ambulatory Visit: Payer: Medicaid Other

## 2018-08-16 DIAGNOSIS — O020 Blighted ovum and nonhydatidiform mole: Secondary | ICD-10-CM

## 2018-08-17 LAB — BETA HCG QUANT (REF LAB): HCG QUANT: 276 m[IU]/mL

## 2018-08-21 ENCOUNTER — Other Ambulatory Visit: Payer: Self-pay | Admitting: *Deleted

## 2018-08-21 DIAGNOSIS — O020 Blighted ovum and nonhydatidiform mole: Secondary | ICD-10-CM

## 2018-08-23 ENCOUNTER — Other Ambulatory Visit: Payer: Medicaid Other

## 2018-08-28 ENCOUNTER — Telehealth: Payer: Self-pay | Admitting: *Deleted

## 2018-08-28 NOTE — Telephone Encounter (Signed)
-----   Message from Conan BowensKelly M Davis, MD sent at 08/24/2018 11:44 AM EST ----- Please call patient and have her come for repeat HCG draw.

## 2018-08-28 NOTE — Telephone Encounter (Signed)
Per chart had a bhcg lab draw appt 08/23/18 that she missed. Has another scheduled 08/30/18. I called home number and left message I am calling re: scheduling another lab appointment- please call our office asap.  I also called her mobile number and was unable to leave a message.

## 2018-08-29 NOTE — Telephone Encounter (Signed)
Pt did not pick up, left voicemail requesting the pt call back in regards to her lab appointment tomorrow 08/30/18.  Requested pt call the office back.  Also attempted on cell phone but pt did not pick up and no voicemail was set up.  Pt has appointment tomorrow 08/30/18 for bhcg.

## 2018-08-30 ENCOUNTER — Other Ambulatory Visit: Payer: Medicaid Other

## 2018-08-30 DIAGNOSIS — O020 Blighted ovum and nonhydatidiform mole: Secondary | ICD-10-CM | POA: Diagnosis not present

## 2018-08-31 ENCOUNTER — Telehealth: Payer: Self-pay | Admitting: *Deleted

## 2018-08-31 LAB — BETA HCG QUANT (REF LAB): hCG Quant: 19 m[IU]/mL

## 2018-08-31 NOTE — Telephone Encounter (Signed)
-----   Message from Conan BowensKelly M Davis, MD sent at 08/31/2018  1:51 PM EST ----- Please call patient and let her know her HCG is decreasing appropriately, cont weekly blood draws.

## 2018-08-31 NOTE — Telephone Encounter (Signed)
Called pt to inform her that her hcg levels were decreasing appropriately and that she needed to continue to keep her weekly blood draws.  Pt did not pick up. Left message requesting pt call the clinic for results.

## 2018-09-06 ENCOUNTER — Other Ambulatory Visit: Payer: Medicaid Other

## 2018-09-06 DIAGNOSIS — O020 Blighted ovum and nonhydatidiform mole: Secondary | ICD-10-CM

## 2018-09-06 NOTE — Telephone Encounter (Signed)
Called pt to inform her that her hcg levels were decreasing appropriately and that we needed to continue the weekly blood draws.  Pt reported that she forgot about her appointment for today 09/06/18 @ 1000 and was advised that she could come in any time to have that lab drawn.  Pt verbalized understanding.

## 2018-09-06 NOTE — Progress Notes (Unsigned)
hcg 

## 2018-09-07 LAB — BETA HCG QUANT (REF LAB): hCG Quant: 9 m[IU]/mL

## 2018-09-12 ENCOUNTER — Telehealth: Payer: Self-pay | Admitting: General Practice

## 2018-09-12 DIAGNOSIS — O020 Blighted ovum and nonhydatidiform mole: Secondary | ICD-10-CM

## 2018-09-12 NOTE — Telephone Encounter (Signed)
Called patient and informed her of results and recommended follow up. Patient verbalized understanding and states she can come 12/23 @ 10am. Patient had no questions.

## 2018-09-12 NOTE — Telephone Encounter (Signed)
-----   Message from Conan BowensKelly M Davis, MD sent at 09/11/2018 12:45 PM EST ----- Decreasing HCG after molar pregnancy, needs repeat lab work 1 week

## 2018-09-18 ENCOUNTER — Other Ambulatory Visit: Payer: Medicaid Other

## 2018-09-27 NOTE — L&D Delivery Note (Signed)
  OB/GYN Faculty Practice Delivery Note  Wendy Atkins is a 28 y.o. [id number] s/p SVD at [redacted]w[redacted]d. She was admitted for IOL for post dates.   ROM: 4h 44m with clear fluid GBS Status: Negative     Labor Progress: Patient received Cytotec, Pitocin and AROM. Received epidural. She progressed to complete with uncomplicated delivery.   Delivery Date/Time: 10/172020 at 1851  Delivery: Called to room and patient was complete and pushing. Head delivered LOA. Delivered through tight nuchal cord. Shoulder and body delivered in usual fashion. Infant with spontaneous cry, placed on mother's abdomen, dried and stimulated. Cord clamped x 2 after 1-minute delay, and cut by family member. Placenta delivered spontaneously with gentle cord traction. Fundus firm with massage and Pitocin. Labia, perineum, vagina, and cervix inspected inspected with no lacerations presents. Brisk bleeding with hemostasis after pitocin and bimanual massage of lower uterine segment.  Baby Weight: pending   Placenta: Sent to L&D Complications:  Tight nuchal cord x1 Lacerations: none EBL: 263 mL Analgesia: Epidural   Infant: APGAR (1 MIN): 7 APGAR (5 MINS): 9 APGAR (10 MINS):     [name], MD, PGY1  [facility] 07/15/2019, 7:17 PM  I saw and evaluated the patient. I agree with the findings and the plan of care as documented in the resident's note.  Barrington Ellison, MD [facility] Fellow, [facility] for [employer], [place]

## 2018-11-18 ENCOUNTER — Encounter (HOSPITAL_COMMUNITY): Payer: Self-pay | Admitting: *Deleted

## 2018-11-18 ENCOUNTER — Inpatient Hospital Stay (HOSPITAL_COMMUNITY)
Admission: AD | Admit: 2018-11-18 | Discharge: 2018-11-19 | Disposition: A | Discharge status: Home / Self Care | Payer: Medicaid Other | Attending: Obstetrics & Gynecology | Admitting: Obstetrics & Gynecology

## 2018-11-18 ENCOUNTER — Inpatient Hospital Stay (HOSPITAL_COMMUNITY): Payer: Medicaid Other

## 2018-11-18 DIAGNOSIS — R109 Unspecified abdominal pain: Secondary | ICD-10-CM | POA: Insufficient documentation

## 2018-11-18 DIAGNOSIS — O418X10 Other specified disorders of amniotic fluid and membranes, first trimester, not applicable or unspecified: Secondary | ICD-10-CM | POA: Diagnosis not present

## 2018-11-18 DIAGNOSIS — Z880 Allergy status to penicillin: Secondary | ICD-10-CM | POA: Diagnosis not present

## 2018-11-18 DIAGNOSIS — O26891 Other specified pregnancy related conditions, first trimester: Secondary | ICD-10-CM | POA: Diagnosis not present

## 2018-11-18 DIAGNOSIS — Z3A01 Less than 8 weeks gestation of pregnancy: Secondary | ICD-10-CM | POA: Insufficient documentation

## 2018-11-18 DIAGNOSIS — O468X1 Other antepartum hemorrhage, first trimester: Secondary | ICD-10-CM

## 2018-11-18 DIAGNOSIS — Z87891 Personal history of nicotine dependence: Secondary | ICD-10-CM | POA: Insufficient documentation

## 2018-11-18 DIAGNOSIS — O4691 Antepartum hemorrhage, unspecified, first trimester: Secondary | ICD-10-CM | POA: Diagnosis not present

## 2018-11-18 DIAGNOSIS — O208 Other hemorrhage in early pregnancy: Secondary | ICD-10-CM | POA: Insufficient documentation

## 2018-11-18 DIAGNOSIS — O209 Hemorrhage in early pregnancy, unspecified: Secondary | ICD-10-CM | POA: Diagnosis not present

## 2018-11-18 DIAGNOSIS — O4690 Antepartum hemorrhage, unspecified, unspecified trimester: Secondary | ICD-10-CM

## 2018-11-18 LAB — URINALYSIS, ROUTINE W REFLEX MICROSCOPIC
Bilirubin Urine: NEGATIVE
Glucose, UA: NEGATIVE mg/dL
Ketones, ur: NEGATIVE mg/dL
Leukocytes,Ua: NEGATIVE
Nitrite: NEGATIVE
PH: 7 (ref 5.0–8.0)
Protein, ur: 30 mg/dL — AB
Specific Gravity, Urine: 1.02 (ref 1.005–1.030)

## 2018-11-18 LAB — URINALYSIS, MICROSCOPIC (REFLEX)

## 2018-11-18 LAB — CBC
HCT: 36.2 % (ref 36.0–46.0)
Hemoglobin: 12 g/dL (ref 12.0–15.0)
MCH: 28.6 pg (ref 26.0–34.0)
MCHC: 33.1 g/dL (ref 30.0–36.0)
MCV: 86.4 fL (ref 80.0–100.0)
Platelets: 340 10*3/uL (ref 150–400)
RBC: 4.19 MIL/uL (ref 3.87–5.11)
RDW: 13.5 % (ref 11.5–15.5)
WBC: 6.2 10*3/uL (ref 4.0–10.5)
nRBC: 0 % (ref 0.0–0.2)

## 2018-11-18 LAB — POCT PREGNANCY, URINE: Preg Test, Ur: POSITIVE — AB

## 2018-11-18 LAB — HCG, QUANTITATIVE, PREGNANCY: hCG, Beta Chain, Quant, S: 41990 m[IU]/mL — ABNORMAL HIGH (ref <5)

## 2018-11-18 NOTE — MAU Note (Addendum)
Had molar pregancy in November. LMP 10/01/18. HAs been bleeding since 0300 and dizziness. Has had some abd pain few days ago but no pain now. Used 4 pads today. Scant amt tan/pink spot noted on pad in Triage

## 2018-11-19 ENCOUNTER — Encounter (HOSPITAL_COMMUNITY): Payer: Self-pay | Admitting: *Deleted

## 2018-11-19 DIAGNOSIS — R109 Unspecified abdominal pain: Secondary | ICD-10-CM

## 2018-11-19 DIAGNOSIS — O468X1 Other antepartum hemorrhage, first trimester: Secondary | ICD-10-CM | POA: Diagnosis not present

## 2018-11-19 DIAGNOSIS — O4691 Antepartum hemorrhage, unspecified, first trimester: Secondary | ICD-10-CM

## 2018-11-19 DIAGNOSIS — O26891 Other specified pregnancy related conditions, first trimester: Secondary | ICD-10-CM

## 2018-11-19 DIAGNOSIS — Z3A01 Less than 8 weeks gestation of pregnancy: Secondary | ICD-10-CM

## 2018-11-19 DIAGNOSIS — O418X10 Other specified disorders of amniotic fluid and membranes, first trimester, not applicable or unspecified: Secondary | ICD-10-CM

## 2018-11-19 NOTE — Progress Notes (Signed)
J Rasch NP in to discuss test results and d/c plan. Written and verbal d/c instructions given and understanding voiced. 

## 2018-11-19 NOTE — Discharge Instructions (Signed)
Subchorionic Hematoma ° °A subchorionic hematoma is a gathering of blood between the outer wall of the embryo (chorion) and the inner wall of the womb (uterus). °This condition can cause vaginal bleeding. If they cause little or no vaginal bleeding, early small hematomas usually shrink on their own and do not affect your baby or pregnancy. When bleeding starts later in pregnancy, or if the hematoma is larger or occurs in older pregnant women, the condition may be more serious. Larger hematomas may get bigger, which increases the chances of miscarriage. This condition also increases the risk of: °· Premature separation of the placenta from the uterus. °· Premature (preterm) labor. °· Stillbirth. °What are the causes? °The exact cause of this condition is not known. It occurs when blood is trapped between the placenta and the uterine wall because the placenta has separated from the original site of implantation. °What increases the risk? °You are more likely to develop this condition if: °· You were treated with fertility medicines. °· You conceived through in vitro fertilization (IVF). °What are the signs or symptoms? °Symptoms of this condition include: °· Vaginal spotting or bleeding. °· Contractions of the uterus. These cause abdominal pain. °Sometimes you may have no symptoms and the bleeding may only be seen when ultrasound images are taken (transvaginal ultrasound). °How is this diagnosed? °This condition is diagnosed based on a physical exam. This includes a pelvic exam. You may also have other tests, including: °· Blood tests. °· Urine tests. °· Ultrasound of the abdomen. °How is this treated? °Treatment for this condition can vary. Treatment may include: °· Watchful waiting. You will be monitored closely for any changes in bleeding. During this stage: °? The hematoma may be reabsorbed by the body. °? The hematoma may separate the fluid-filled space containing the embryo (gestational sac) from the wall of the  womb (endometrium). °· Medicines. °· Activity restriction. This may be needed until the bleeding stops. °Follow these instructions at home: °· Stay on bed rest if told to do so by your health care provider. °· Do not lift anything that is heavier than 10 lbs. (4.5 kg) or as told by your health care provider. °· Do not use any products that contain nicotine or tobacco, such as cigarettes and e-cigarettes. If you need help quitting, ask your health care provider. °· Track and write down the number of pads you use each day and how soaked (saturated) they are. °· Do not use tampons. °· Keep all follow-up visits as told by your health care provider. This is important. Your health care provider may ask you to have follow-up blood tests or ultrasound tests or both. °Contact a health care provider if: °· You have any vaginal bleeding. °· You have a fever. °Get help right away if: °· You have severe cramps in your stomach, back, abdomen, or pelvis. °· You pass large clots or tissue. Save any tissue for your health care provider to look at. °· You have more vaginal bleeding, and you faint or become lightheaded or weak. °Summary °· A subchorionic hematoma is a gathering of blood between the outer wall of the placenta and the uterus. °· This condition can cause vaginal bleeding. °· Sometimes you may have no symptoms and the bleeding may only be seen when ultrasound images are taken. °· Treatment may include watchful waiting, medicines, or activity restriction. °This information is not intended to replace advice given to you by your health care provider. Make sure you discuss any questions you   have with your health care provider. °Document Released: 12/29/2006 Document Revised: 11/09/2016 Document Reviewed: 11/09/2016 °Elsevier Interactive Patient Education © 2019 [employer] ° °

## 2018-11-19 NOTE — MAU Provider Note (Signed)
History     CSN: [id number]  Arrival date and time: 11/18/18 2024   First Provider Initiated Contact with Patient 11/19/18 0033      Chief Complaint  Patient presents with  . Vaginal Bleeding  . Dizziness   HPI   [name] is a 28 y.o. female [phone] [id number] @ Unknown here with vaginal bleeding that started 24 hours ago. She says the  bleeding is similar to menstrual bleeding. She does not have any pain now, however in the last few days she has had abdominal cramping. She has not taken anything for the pain.  The pain comes and goes and feels like menstrual cramps.   OB History    Gravida  6   Para  3   Term  3   Preterm      AB  2   Living  3     SAB  1   TAB      Ectopic      Multiple      Live Births              Past Medical History:  Diagnosis Date  . Medical history non-contributory   . Pregnant   . SVD (spontaneous vaginal delivery)    x 3    Past Surgical History:  Procedure Laterality Date  . DILATION AND EVACUATION N/A 08/04/2018   Procedure: DILATATION AND EVACUATION - Ultrasound Guided;  Surgeon: Conan Bowensavis, Kelly M, MD;  Location: [facility];  Service: Gynecology;  Laterality: N/A;  . NO PAST SURGERIES      Family History  Problem Relation Age of Onset  . Diabetes Mother   . Diabetes Father   . Hypertension Father     Social History   Tobacco Use  . Smoking status: Former Smoker    Packs/day: 0.50    Years: 5.00    Pack years: 2.50    Types: Cigarettes    Last attempt to quit: 06/11/2018    Years since quitting: 0.4  . Smokeless tobacco: Never Used  Substance Use Topics  . Alcohol use: Yes    Alcohol/week: 2.0 standard drinks    Types: 2 Shots of liquor per week  . Drug use: No    Allergies:  Allergies  Allergen Reactions  . Ampicillin Itching    Itching arm while antibiotic infusing Has patient had a PCN reaction causing immediate rash, facial/tongue/throat swelling, SOB or lightheadedness with hypotension: Yes Has patient  had a PCN reaction causing severe rash involving mucus membranes or skin necrosis: No Has patient had a PCN reaction that required hospitalization: No Has patient had a PCN reaction occurring within the last 10 years: Yes If all of the above answers are "NO", then may proceed with Cephalosporin use.     Medications Prior to Admission  Medication Sig Dispense Refill Last Dose  . HYDROcodone-acetaminophen (NORCO/VICODIN) 5-325 MG tablet Take 1 tablet by mouth every 6 (six) hours as needed. 12 tablet 0   . ibuprofen (ADVIL,MOTRIN) 600 MG tablet Take 1 tablet (600 mg total) by mouth every 6 (six) hours as needed. 20 tablet 0    Results for orders placed or performed during the hospital encounter of 11/18/18 (from the past 48 hour(s))  CBC     Status: None   Collection Time: 11/18/18  9:15 PM  Result Value Ref Range   WBC 6.2 4.0 - 10.5 K/uL   RBC 4.19 3.87 - 5.11 MIL/uL   Hemoglobin 12.0 12.0 - 15.0 g/dL  HCT 36.2 36.0 - 46.0 %   MCV 86.4 80.0 - 100.0 fL   MCH 28.6 26.0 - 34.0 pg   MCHC 33.1 30.0 - 36.0 g/dL   RDW 70.4 88.8 - 91.6 %   Platelets 340 150 - 400 K/uL   nRBC 0.0 0.0 - 0.2 %    Comment: Performed at [facility], [address], [place], [place] [address]  hCG, quantitative, pregnancy     Status: Abnormal   Collection Time: 11/18/18  9:15 PM  Result Value Ref Range   hCG, Beta Chain, Quant, S 41,990 (H) <5 mIU/mL    Comment:          GEST. AGE      CONC.  (mIU/mL)   <=1 WEEK        5 - 50     2 WEEKS       50 - 500     3 WEEKS       100 - 10,000     4 WEEKS     1,000 - 30,000     5 WEEKS     3,500 - 115,000   6-8 WEEKS     12,000 - 270,000    12 WEEKS     15,000 - 220,000        FEMALE AND NON-PREGNANT FEMALE:     LESS THAN 5 mIU/mL Performed at [facility], [address] [place], [place] [address]   Urinalysis, Routine w reflex microscopic     Status: Abnormal   Collection Time: 11/18/18  9:24 PM  Result Value Ref Range   Color, Urine YELLOW YELLOW    APPearance CLEAR CLEAR   Specific Gravity, Urine 1.020 1.005 - 1.030   pH 7.0 5.0 - 8.0   Glucose, UA NEGATIVE NEGATIVE mg/dL   Hgb urine dipstick LARGE (A) NEGATIVE   Bilirubin Urine NEGATIVE NEGATIVE   Ketones, ur NEGATIVE NEGATIVE mg/dL   Protein, ur 30 (A) NEGATIVE mg/dL   Nitrite NEGATIVE NEGATIVE   Leukocytes,Ua NEGATIVE NEGATIVE    Comment: Performed at [facility], [address], [place], [place] [address]  Urinalysis, Microscopic (reflex)     Status: Abnormal   Collection Time: 11/18/18  9:24 PM  Result Value Ref Range   RBC / HPF 6-10 0 - 5 RBC/hpf   WBC, UA 0-5 0 - 5 WBC/hpf   Bacteria, UA FEW (A) NONE SEEN   Squamous Epithelial / LPF 0-5 0 - 5    Comment: Performed at [facility], [address], [place], [place] [address]  Pregnancy, urine POC     Status: Abnormal   Collection Time: 11/18/18  9:26 PM  Result Value Ref Range   Preg Test, Ur POSITIVE (A) NEGATIVE    Comment:        THE SENSITIVITY OF THIS METHODOLOGY IS >24 mIU/mL     US Ob Less Than 14 Weeks With Ob Transvaginal  Result Date: 11/18/2018 CLINICAL DATA:  28 year old pregnant female with vaginal bleeding. LMP: 10/01/2018 corresponding to an estimated gestational age of [redacted] weeks, 6 days. EXAM: OBSTETRIC <14 WK [place] AND TRANSVAGINAL OB US TECHNIQUE: Both transabdominal and transvaginal ultrasound examinations were performed for complete evaluation of the gestation as well as the maternal uterus, adnexal regions, and pelvic cul-de-sac. Transvaginal technique was performed to assess early pregnancy. COMPARISON:  None. FINDINGS: Intrauterine gestational sac: Single intrauterine gestational sac. Yolk sac:  Seen Embryo:  Present Cardiac Activity: Detected Heart Rate: 121 bpm CRL:  8 mm  6 w   4 d                  [place] EDC: 07/10/2019 Subchorionic hemorrhage: There is a large subchorionic hemorrhage to the left of the gestational sac and measuring approximately 6 x 3 x 3 cm and abutting 40% of the circumference  of the gestational sac. Maternal uterus/adnexae: There is a 2.3 x 2.4 x 1.8 cm right lower uterine fibroid. The ovaries are unremarkable. No significant free fluid within the pelvis. IMPRESSION: 1. Single live intrauterine pregnancy with an estimated gestational age of [redacted] weeks, 4 days. 2. Large subchorionic hemorrhage. Electronically Signed   By: Elgie Collard M.D.   On: 11/18/2018 23:52   Review of Systems  Constitutional: Negative for fever.  Gastrointestinal: Negative for abdominal pain.  Genitourinary: Positive for vaginal bleeding.   Physical Exam   Blood pressure (!) 116/53, pulse 79, temperature 98.1 F (36.7 C), resp. rate 18, height 5\' 7"  (1.702 m), weight 115.2 kg, last menstrual period 10/01/2018, unknown if currently breastfeeding.  Physical Exam  Constitutional: She is oriented to person, place, and time. She appears well-developed and well-nourished. No distress.  Genitourinary:    Genitourinary Comments: Dilation: Closed Effacement (%): Thick Exam by:: Venia Carbon NP   Musculoskeletal: Normal range of motion.  Neurological: She is alert and oriented to person, place, and time.  Skin: Skin is warm. She is not diaphoretic.  Psychiatric: Her behavior is normal.  Small amount of dark red blood noted on exam glove.   MAU Course  Procedures  None  MDM  B positive blood type  Wet prep & GC HIV, CBC, Hcg, ABO US OB transvaginal   Assessment and Plan   A:  1. Vaginal bleeding in pregnancy   2. Abdominal pain in pregnancy, first trimester   3. Subchorionic hematoma in first trimester, single or unspecified fetus     P:  Discharge home in stable condition Discussed large subchorionic hemorrhage in detail with patient. She is aware of the risk of miscarriage. Discussed reasons to return Pelvic rest Ok to use tylenol as needed, as directed on the bottle Return if symptoms worsen   Rasch, Harolyn Rutherford, NP 11/19/2018 9:04 PM

## 2018-12-04 ENCOUNTER — Encounter: Payer: Self-pay | Admitting: Obstetrics and Gynecology

## 2018-12-04 DIAGNOSIS — O09A0 Supervision of pregnancy with history of molar pregnancy, unspecified trimester: Secondary | ICD-10-CM | POA: Insufficient documentation

## 2018-12-19 ENCOUNTER — Encounter (HOSPITAL_COMMUNITY): Payer: Self-pay

## 2018-12-19 ENCOUNTER — Ambulatory Visit (HOSPITAL_COMMUNITY)
Admission: EM | Admit: 2018-12-19 | Discharge: 2018-12-19 | Disposition: A | Discharge status: Home / Self Care | Payer: Medicaid Other | Attending: Family Medicine | Admitting: Family Medicine

## 2018-12-19 ENCOUNTER — Other Ambulatory Visit: Payer: Self-pay

## 2018-12-19 DIAGNOSIS — N898 Other specified noninflammatory disorders of vagina: Secondary | ICD-10-CM | POA: Diagnosis not present

## 2018-12-19 NOTE — Discharge Instructions (Signed)
We have sent testing for sexually transmitted and/or vaginal infections. We will notify you of any positive results once they are received. If required, we will prescribe any medications you might need.  Please refrain from all sexual activity until your testing results are back.

## 2018-12-19 NOTE — ED Provider Notes (Signed)
[facility]   [id number] 12/19/18 Arrival Time: 1321  ASSESSMENT & PLAN:  1. Vaginal discharge    [redacted] weeks pregnant. Has scheduled f/u for initial OB visit. No s/s of PID.  No empiric treatment. Prefers to await vaginal cytology results.   Discharge Instructions     We have sent testing for sexually transmitted and/or vaginal infections. We will notify you of any positive results once they are received. If required, we will prescribe any medications you might need.  Please refrain from all sexual activity until your testing results are back.    Pending: Labs Reviewed  CERVICOVAGINAL ANCILLARY ONLY    Reviewed expectations re: course of current medical issues. Questions answered. Outlined signs and symptoms indicating need for more acute intervention. Patient verbalized understanding. After Visit Summary given.   SUBJECTIVE:  Wendy Atkins is a 28 y.o. female who presents with complaint of vaginal discharge. Onset gradual. First noticed a couple of weeks ago. Describes discharge as thin and white/clear. H/O BV and this feels similar. Urinary symptoms: none. Afebrile. No abdominal or pelvic pain. No n/v. No rashes or lesions. Sexually active with single female partner without regular condom use. OTC treatment: none.  Patient's last menstrual period was 10/01/2018.  ROS: As per HPI.  OBJECTIVE:  Vitals:   12/19/18 1341  BP: 125/84  Pulse: 97  Resp: 18  Temp: 97.7 F (36.5 C)  SpO2: 97%  Weight: 113.4 kg     General appearance: alert, cooperative, appears stated age and no distress Throat: lips, mucosa, and tongue normal; teeth and gums normal CV: RRR Lungs: CTAB Back: no CVA tenderness; FROM at waist Abdomen: soft, non-tender GU: deferred Skin: warm and dry Psychological: alert and cooperative; normal mood and affect.   Allergies  Allergen Reactions  . Ampicillin Itching    Itching arm while antibiotic infusing Has patient had a PCN reaction  causing immediate rash, facial/tongue/throat swelling, SOB or lightheadedness with hypotension: Yes Has patient had a PCN reaction causing severe rash involving mucus membranes or skin necrosis: No Has patient had a PCN reaction that required hospitalization: No Has patient had a PCN reaction occurring within the last 10 years: Yes If all of the above answers are "NO", then may proceed with Cephalosporin use.     Past Medical History:  Diagnosis Date  . Medical history non-contributory   . Pregnant   . SVD (spontaneous vaginal delivery)    x 3   Family History  Problem Relation Age of Onset  . Diabetes Mother   . Diabetes Father   . Hypertension Father    Social History   Socioeconomic History  . Marital status: Single    Spouse name: Not on file  . Number of children: 3  . Years of education: Not on file  . Highest education level: Not on file  Occupational History  . Not on file  Social Needs  . Financial resource strain: Not on file  . Food insecurity:    Worry: Not on file    Inability: Not on file  . Transportation needs:    Medical: Not on file    Non-medical: Not on file  Tobacco Use  . Smoking status: Former Smoker    Packs/day: 0.50    Years: 5.00    Pack years: 2.50    Types: Cigarettes    Last attempt to quit: 06/11/2018    Years since quitting: 0.5  . Smokeless tobacco: Never Used  Substance and Sexual Activity  .  Alcohol use: Yes    Alcohol/week: 2.0 standard drinks    Types: 2 Shots of liquor per week  . Drug use: No  . Sexual activity: Yes    Comment: approx [redacted] wks gestation  Lifestyle  . Physical activity:    Days per week: Not on file    Minutes per session: Not on file  . Stress: Not on file  Relationships  . Social connections:    Talks on phone: Not on file    Gets together: Not on file    Attends religious service: Not on file    Active member of club or organization: Not on file    Attends meetings of clubs or organizations: Not on  file    Relationship status: Not on file  . Intimate partner violence:    Fear of current or ex partner: Not on file    Emotionally abused: Not on file    Physically abused: Not on file    Forced sexual activity: Not on file  Other Topics Concern  . Not on file  Social History Narrative  . Not on file          Mardella Layman, MD 12/19/18 [phone]

## 2018-12-19 NOTE — ED Triage Notes (Signed)
[redacted] weeks pregnant  . Travel  [place] 3/13//2020 fever none cough none. Pt cc vaginal discharge x 2 weeks.

## 2018-12-20 LAB — CERVICOVAGINAL ANCILLARY ONLY
Bacterial vaginitis: POSITIVE — AB
Candida vaginitis: POSITIVE — AB
Chlamydia: NEGATIVE
Neisseria Gonorrhea: NEGATIVE
Trichomonas: NEGATIVE

## 2018-12-21 ENCOUNTER — Telehealth (HOSPITAL_COMMUNITY): Payer: Self-pay | Admitting: Emergency Medicine

## 2018-12-21 MED ORDER — METRONIDAZOLE 500 MG PO TABS: 250.0000 mg | ORAL_TABLET | Freq: Two times a day (BID) | ORAL | 0 refills | Status: AC

## 2018-12-21 MED ORDER — TERCONAZOLE 0.4 % VA CREA: 1.0000 | TOPICAL_CREAM | Freq: Every day | VAGINAL | 0 refills | Status: AC

## 2018-12-21 NOTE — Telephone Encounter (Signed)
Bacterial vaginosis is positive. This was not treated at the urgent care visit.  Flagyl 250 mg BID x 7 days #14 no refills sent to patients pharmacy of choice.    Test for candida (yeast) was positive.  Prescription for terconazole cream, sent to the pharmacy of record.  Recheck or followup with PCP for further evaluation if symptoms are not improving.  Attempted to reach patient. No answer at this time. Voicemail left.

## 2018-12-22 ENCOUNTER — Telehealth (HOSPITAL_COMMUNITY): Payer: Self-pay | Admitting: Emergency Medicine

## 2018-12-22 NOTE — Telephone Encounter (Signed)
Patient contacted and made aware of all results, all questions answered.   

## 2018-12-27 ENCOUNTER — Telehealth: Payer: Self-pay | Admitting: Family Medicine

## 2018-12-27 DIAGNOSIS — O468X1 Other antepartum hemorrhage, first trimester: Secondary | ICD-10-CM

## 2018-12-27 DIAGNOSIS — O418X10 Other specified disorders of amniotic fluid and membranes, first trimester, not applicable or unspecified: Secondary | ICD-10-CM

## 2018-12-27 DIAGNOSIS — Z3480 Encounter for supervision of other normal pregnancy, unspecified trimester: Secondary | ICD-10-CM

## 2018-12-27 NOTE — Telephone Encounter (Signed)
Discussed with Dr. Marice Potter that she does not need any other studies right now and is ok for her appt to be 5/5 due to Covid-19 issues; but if she is having bleeding, pain, etc- she should go to MAU for evaluation.Also requested I order her 19 week anatomy scan.   I called Arielle and we discussed her concerns. She denies any bleeding since MAU visit- denies pain.  I informed her it is ok for her appt to be 5/5 and I gave her the [place] appointment. I instructed her to go to MAU if she begins to have vaginal bleeding or pain as this may indicate hemorrhage again. She voices understanding.

## 2018-12-27 NOTE — Telephone Encounter (Signed)
The patient called in to schedule a new ob appointment. Received proof of pregnancy from the ER. She is 11 weeks and has some concerns about subchronic hemorrhage. Informed of the New Ob appointment updated to virtual or telephone due to COVID19.

## 2019-01-01 DIAGNOSIS — Z3009 Encounter for other general counseling and advice on contraception: Secondary | ICD-10-CM | POA: Diagnosis not present

## 2019-01-01 DIAGNOSIS — Z1388 Encounter for screening for disorder due to exposure to contaminants: Secondary | ICD-10-CM | POA: Diagnosis not present

## 2019-01-01 DIAGNOSIS — Z0389 Encounter for observation for other suspected diseases and conditions ruled out: Secondary | ICD-10-CM | POA: Diagnosis not present

## 2019-01-01 DIAGNOSIS — Z3481 Encounter for supervision of other normal pregnancy, first trimester: Secondary | ICD-10-CM | POA: Diagnosis not present

## 2019-01-01 LAB — OB RESULTS CONSOLE RUBELLA ANTIBODY, IGM: Rubella: IMMUNE

## 2019-01-01 LAB — OB RESULTS CONSOLE ABO/RH: RH Type: POSITIVE

## 2019-01-01 LAB — OB RESULTS CONSOLE HIV ANTIBODY (ROUTINE TESTING): HIV: NONREACTIVE

## 2019-01-01 LAB — OB RESULTS CONSOLE HEPATITIS B SURFACE ANTIGEN: Hepatitis B Surface Ag: NEGATIVE

## 2019-01-15 DIAGNOSIS — Z3689 Encounter for other specified antenatal screening: Secondary | ICD-10-CM | POA: Diagnosis not present

## 2019-01-30 ENCOUNTER — Telehealth: Payer: Self-pay | Admitting: Advanced Practice Midwife

## 2019-01-30 ENCOUNTER — Other Ambulatory Visit: Payer: Self-pay

## 2019-01-30 ENCOUNTER — Ambulatory Visit (INDEPENDENT_AMBULATORY_CARE_PROVIDER_SITE_OTHER): Payer: Medicaid Other | Admitting: *Deleted

## 2019-01-30 DIAGNOSIS — Z3480 Encounter for supervision of other normal pregnancy, unspecified trimester: Secondary | ICD-10-CM

## 2019-01-30 NOTE — Progress Notes (Signed)
Pt was contacted by Wendy Atkins in order to be sure she had downloaded Webex app in preparation for virtual visit today @ 3:15 pm. Pt stated that she is receiving prenatal care from [facility] and requested to cancel her appt with this office.

## 2019-01-30 NOTE — Telephone Encounter (Signed)
Called the patient to confirm the appointment, and all ensure the webex app is downloaded. Left a detailed voicemail.

## 2019-02-02 NOTE — Telephone Encounter (Signed)
Opened in error

## 2019-02-14 ENCOUNTER — Ambulatory Visit (HOSPITAL_COMMUNITY)
Admission: RE | Admit: 2019-02-14 | Discharge: 2019-02-14 | Disposition: A | Discharge status: Home / Self Care | Payer: Medicaid Other | Source: Ambulatory Visit | Attending: Obstetrics and Gynecology | Admitting: Obstetrics and Gynecology

## 2019-02-14 ENCOUNTER — Other Ambulatory Visit: Payer: Self-pay | Admitting: Obstetrics & Gynecology

## 2019-02-14 ENCOUNTER — Other Ambulatory Visit (HOSPITAL_COMMUNITY): Payer: Self-pay | Admitting: *Deleted

## 2019-02-14 ENCOUNTER — Other Ambulatory Visit: Payer: Self-pay

## 2019-02-14 DIAGNOSIS — Z3480 Encounter for supervision of other normal pregnancy, unspecified trimester: Secondary | ICD-10-CM | POA: Diagnosis not present

## 2019-02-14 DIAGNOSIS — O418X10 Other specified disorders of amniotic fluid and membranes, first trimester, not applicable or unspecified: Secondary | ICD-10-CM

## 2019-02-14 DIAGNOSIS — O468X1 Other antepartum hemorrhage, first trimester: Secondary | ICD-10-CM | POA: Insufficient documentation

## 2019-02-14 DIAGNOSIS — O99212 Obesity complicating pregnancy, second trimester: Secondary | ICD-10-CM | POA: Diagnosis not present

## 2019-02-14 DIAGNOSIS — O09292 Supervision of pregnancy with other poor reproductive or obstetric history, second trimester: Secondary | ICD-10-CM

## 2019-02-14 DIAGNOSIS — Z3A19 19 weeks gestation of pregnancy: Secondary | ICD-10-CM

## 2019-02-14 DIAGNOSIS — Z363 Encounter for antenatal screening for malformations: Secondary | ICD-10-CM

## 2019-02-14 DIAGNOSIS — Z362 Encounter for other antenatal screening follow-up: Secondary | ICD-10-CM

## 2019-03-14 ENCOUNTER — Other Ambulatory Visit: Payer: Self-pay

## 2019-03-14 ENCOUNTER — Ambulatory Visit (HOSPITAL_COMMUNITY)
Admission: RE | Admit: 2019-03-14 | Discharge: 2019-03-14 | Disposition: A | Discharge status: Home / Self Care | Payer: Medicaid Other | Source: Ambulatory Visit | Attending: Maternal & Fetal Medicine | Admitting: Maternal & Fetal Medicine

## 2019-03-14 DIAGNOSIS — O99212 Obesity complicating pregnancy, second trimester: Secondary | ICD-10-CM

## 2019-03-14 DIAGNOSIS — Z3A23 23 weeks gestation of pregnancy: Secondary | ICD-10-CM | POA: Diagnosis not present

## 2019-03-14 DIAGNOSIS — Z362 Encounter for other antenatal screening follow-up: Secondary | ICD-10-CM

## 2019-03-14 DIAGNOSIS — O09292 Supervision of pregnancy with other poor reproductive or obstetric history, second trimester: Secondary | ICD-10-CM | POA: Diagnosis not present

## 2019-04-19 DIAGNOSIS — Z3009 Encounter for other general counseling and advice on contraception: Secondary | ICD-10-CM | POA: Diagnosis not present

## 2019-04-19 DIAGNOSIS — Z0389 Encounter for observation for other suspected diseases and conditions ruled out: Secondary | ICD-10-CM | POA: Diagnosis not present

## 2019-04-19 DIAGNOSIS — Z3483 Encounter for supervision of other normal pregnancy, third trimester: Secondary | ICD-10-CM | POA: Diagnosis not present

## 2019-04-19 DIAGNOSIS — Z1388 Encounter for screening for disorder due to exposure to contaminants: Secondary | ICD-10-CM | POA: Diagnosis not present

## 2019-06-04 ENCOUNTER — Inpatient Hospital Stay (HOSPITAL_COMMUNITY)
Admission: AD | Admit: 2019-06-04 | Discharge: 2019-06-04 | Disposition: A | Discharge status: Home / Self Care | Payer: Medicaid Other | Attending: Obstetrics and Gynecology | Admitting: Obstetrics and Gynecology

## 2019-06-04 ENCOUNTER — Other Ambulatory Visit: Payer: Self-pay

## 2019-06-04 ENCOUNTER — Encounter (HOSPITAL_COMMUNITY): Payer: Self-pay | Admitting: *Deleted

## 2019-06-04 DIAGNOSIS — N949 Unspecified condition associated with female genital organs and menstrual cycle: Secondary | ICD-10-CM | POA: Diagnosis not present

## 2019-06-04 DIAGNOSIS — Z3A34 34 weeks gestation of pregnancy: Secondary | ICD-10-CM | POA: Diagnosis not present

## 2019-06-04 DIAGNOSIS — Z880 Allergy status to penicillin: Secondary | ICD-10-CM | POA: Diagnosis not present

## 2019-06-04 DIAGNOSIS — N898 Other specified noninflammatory disorders of vagina: Secondary | ICD-10-CM | POA: Diagnosis present

## 2019-06-04 DIAGNOSIS — Z8249 Family history of ischemic heart disease and other diseases of the circulatory system: Secondary | ICD-10-CM | POA: Insufficient documentation

## 2019-06-04 DIAGNOSIS — Z87891 Personal history of nicotine dependence: Secondary | ICD-10-CM | POA: Insufficient documentation

## 2019-06-04 DIAGNOSIS — Z833 Family history of diabetes mellitus: Secondary | ICD-10-CM | POA: Diagnosis not present

## 2019-06-04 DIAGNOSIS — R102 Pelvic and perineal pain: Secondary | ICD-10-CM | POA: Insufficient documentation

## 2019-06-04 DIAGNOSIS — O26893 Other specified pregnancy related conditions, third trimester: Secondary | ICD-10-CM | POA: Insufficient documentation

## 2019-06-04 HISTORY — DX: Depression, unspecified: F32.A

## 2019-06-04 HISTORY — DX: Gonococcal infection, unspecified: A54.9

## 2019-06-04 LAB — URINALYSIS, ROUTINE W REFLEX MICROSCOPIC
Bilirubin Urine: NEGATIVE
Glucose, UA: 50 mg/dL — AB
Hgb urine dipstick: NEGATIVE
Ketones, ur: NEGATIVE mg/dL
Nitrite: NEGATIVE
Protein, ur: NEGATIVE mg/dL
Specific Gravity, Urine: 1.003 — ABNORMAL LOW (ref 1.005–1.030)
pH: 7 (ref 5.0–8.0)

## 2019-06-04 LAB — WET PREP, GENITAL
Clue Cells Wet Prep HPF POC: NONE SEEN
Sperm: NONE SEEN
Trich, Wet Prep: NONE SEEN
Yeast Wet Prep HPF POC: NONE SEEN

## 2019-06-04 MED ORDER — CYCLOBENZAPRINE HCL 10 MG PO TABS: 10.0000 mg | ORAL_TABLET | Freq: Two times a day (BID) | ORAL | 0 refills | Status: DC | PRN

## 2019-06-04 MED ORDER — COMFORT FIT MATERNITY SUPP LG MISC: 1.0000 [IU] | Freq: Every day | 0 refills | Status: DC | PRN

## 2019-06-04 NOTE — MAU Note (Signed)
Having some abd pain, doesn't feel like contractions, just a tightening, not rhythmic.  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  Since then d/c has been watery. No bleeding.  Baby hadn't been moving as much today, though is starting to now.

## 2019-06-04 NOTE — Discharge Instructions (Signed)
[facility]   [address], [place], [place] [address] Phone: [phone]  Monday     8:30AM-5PM Tuesday 8:30AM-5PM Wednesday 8:30AM-5PM Thursday 8:30AM-5PM Friday  8:30AM-5PM Saturday Closed Sunday Closed    Fetal Movement Counts Patient Name: ________________________________________________ Patient Due Date: ____________________ What is a fetal movement count?  A fetal movement count is the number of times that you feel your baby move during a certain amount of time. This may also be called a fetal kick count. A fetal movement count is recommended for every pregnant woman. You may be asked to start counting fetal movements as early as week 28 of your pregnancy. Pay attention to when your baby is most active. You may notice your baby's sleep and wake cycles. You may also notice things that make your baby move more. You should do a fetal movement count:  When your baby is normally most active.  At the same time each day. A good time to count movements is while you are resting, after having something to eat and drink. How do I count fetal movements? 1. Find a quiet, comfortable area. Sit, or lie down on your side. 2. Write down the date, the start time and stop time, and the number of movements that you felt between those two times. Take this information with you to your health care visits. 3. For 2 hours, count kicks, flutters, swishes, rolls, and jabs. You should feel at least 10 movements during 2 hours. 4. You may stop counting after you have felt 10 movements. 5. If you do not feel 10 movements in 2 hours, have something to eat and drink. Then, keep resting and counting for 1 hour. If you feel at least 4 movements during that hour, you may stop counting. Contact a health care provider if:  You feel fewer than 4 movements in 2 hours.  Your baby is not moving like he or she usually does. Date: ____________ Start time: ____________ Stop time: ____________  Movements: ____________ Date: ____________ Start time: ____________ Stop time: ____________ Movements: ____________ Date: ____________ Start time: ____________ Stop time: ____________ Movements: ____________ Date: ____________ Start time: ____________ Stop time: ____________ Movements: ____________ Date: ____________ Start time: ____________ Stop time: ____________ Movements: ____________ Date: ____________ Start time: ____________ Stop time: ____________ Movements: ____________ Date: ____________ Start time: ____________ Stop time: ____________ Movements: ____________ Date: ____________ Start time: ____________ Stop time: ____________ Movements: ____________ Date: ____________ Start time: ____________ Stop time: ____________ Movements: ____________ This information is not intended to replace advice given to you by your health care provider. Make sure you discuss any questions you have with your health care provider. Document Released: 10/13/2006 Document Revised: 10/03/2018 Document Reviewed: 10/23/2015 Elsevier Patient Education  2020 Elsevier Inc[employer] of the uterus can occur throughout pregnancy, but they are not always a sign that you are in labor. You may have practice contractions called Braxton Hicks contractions. These false labor contractions are sometimes confused with true labor. What are [place] contractions? Braxton Hicks contractions are tightening movements that occur in the muscles of the uterus before labor. Unlike true labor contractions, these contractions do not result in opening (dilation) and thinning of the cervix. Toward the end of pregnancy (32-34 weeks), Braxton Hicks contractions can happen more often and may become stronger. These contractions are sometimes difficult to tell apart from true labor because they can be very uncomfortable. You should not feel embarrassed if you go to the hospital with false labor. Sometimes, the  only  way to tell if you are in true labor is for your health care provider to look for changes in the cervix. The health care provider will do a physical exam and may monitor your contractions. If you are not in true labor, the exam should show that your cervix is not dilating and your water has not broken. If there are no other health problems associated with your pregnancy, it is completely safe for you to be sent home with false labor. You may continue to have Braxton Hicks contractions until you go into true labor. How to tell the difference between true labor and false labor True labor  Contractions last 30-70 seconds.  Contractions become very regular.  Discomfort is usually felt in the top of the uterus, and it spreads to the lower abdomen and low back.  Contractions do not go away with walking.  Contractions usually become more intense and increase in frequency.  The cervix dilates and gets thinner. False labor  Contractions are usually shorter and not as strong as true labor contractions.  Contractions are usually irregular.  Contractions are often felt in the front of the lower abdomen and in the groin.  Contractions may go away when you walk around or change positions while lying down.  Contractions get weaker and are shorter-lasting as time goes on.  The cervix usually does not dilate or become thin. Follow these instructions at home:   Take over-the-counter and prescription medicines only as told by your health care provider.  Keep up with your usual exercises and follow other instructions from your health care provider.  Eat and drink lightly if you think you are going into labor.  If Braxton Hicks contractions are making you uncomfortable: ? Change your position from lying down or resting to walking, or change from walking to resting. ? Sit and rest in a tub of warm water. ? Drink enough fluid to keep your urine pale yellow. Dehydration may cause these  contractions. ? Do slow and deep breathing several times an hour.  Keep all follow-up prenatal visits as told by your health care provider. This is important. Contact a health care provider if:  You have a fever.  You have continuous pain in your abdomen. Get help right away if:  Your contractions become stronger, more regular, and closer together.  You have fluid leaking or gushing from your vagina.  You pass blood-tinged mucus (bloody show).  You have bleeding from your vagina.  You have low back pain that you never had before.  You feel your babys head pushing down and causing pelvic pressure.  Your baby is not moving inside you as much as it used to. Summary  Contractions that occur before labor are called Braxton Hicks contractions, false labor, or practice contractions.  Braxton Hicks contractions are usually shorter, weaker, farther apart, and less regular than true labor contractions. True labor contractions usually become progressively stronger and regular, and they become more frequent.  Manage discomfort from [facility] contractions by changing position, resting in a warm bath, drinking plenty of water, or practicing deep breathing. This information is not intended to replace advice given to you by your health care provider. Make sure you discuss any questions you have with your health care provider. Document Released: 01/27/2017 Document Revised: 08/26/2017 Document Reviewed: 01/27/2017 Elsevier Patient Education  [place]. Round Ligament Pain  The round ligament is a cord of muscle and tissue that helps support the uterus. It can become  a source of pain during pregnancy if it becomes stretched or twisted as the baby grows. The pain usually begins in the second trimester (13-28 weeks) of pregnancy, and it can come and go until the baby is delivered. It is not a serious problem, and it does not cause harm to the baby. °Round ligament pain is usually a  short, sharp, and pinching pain, but it can also be a dull, lingering, and aching pain. The pain is felt in the lower side of the abdomen or in the groin. It usually starts deep in the groin and moves up to the outside of the hip area. The pain may occur when you: °· Suddenly change position, such as quickly going from a sitting to standing position. °· Roll over in bed. °· Cough or sneeze. °· Do physical activity. °Follow these instructions at home: ° °· Watch your condition for any changes. °· When the pain starts, relax. Then try any of these methods to help with the pain: °? Sitting down. °? Flexing your knees up to your abdomen. °? Lying on your side with one pillow under your abdomen and another pillow between your legs. °? Sitting in a warm bath for 15-20 minutes or until the pain goes away. °· Take over-the-counter and prescription medicines only as told by your health care provider. °· Move slowly when you sit down or stand up. °· Avoid long walks if they cause pain. °· Stop or reduce your physical activities if they cause pain. °· Keep all follow-up visits as told by your health care provider. This is important. °Contact a health care provider if: °· Your pain does not go away with treatment. °· You feel pain in your back that you did not have before. °· Your medicine is not helping. °Get help right away if: °· You have a fever or chills. °· You develop uterine contractions. °· You have vaginal bleeding. °· You have nausea or vomiting. °· You have diarrhea. °· You have pain when you urinate. °Summary °· Round ligament pain is felt in the lower abdomen or groin. It is usually a short, sharp, and pinching pain. It can also be a dull, lingering, and aching pain. °· This pain usually begins in the second trimester (13-28 weeks). It occurs because the uterus is stretching with the growing baby, and it is not harmful to the baby. °· You may notice the pain when you suddenly change position, when you cough or  sneeze, or during physical activity. °· Relaxing, flexing your knees to your abdomen, lying on one side, or taking a warm bath may help to get rid of the pain. °· Get help from your health care provider if the pain does not go away or if you have vaginal bleeding, nausea, vomiting, diarrhea, or painful urination. °This information is not intended to replace advice given to you by your health care provider. Make sure you discuss any questions you have with your health care provider. °Document Released: 06/22/2008 Document Revised: 03/01/2018 Document Reviewed: 03/01/2018 °Elsevier Patient Education © 2020 Elsevier Inc. ° °

## 2019-06-04 NOTE — MAU Provider Note (Signed)
History     CSN: [id number]  Arrival date and time: 06/04/19 1745    First Provider Initiated Contact with Patient 06/04/19 1825       Chief Complaint  Patient presents with  . Vaginal Discharge  . Abdominal Pain   HPI  Wendy Atkins is a 28 y.o. [id number] at [id number] who presents to MAU today with complaint of right sided pelvic pain that comes and goes. It is sharp in nature. It is not related to movement and can happen at rest. The pain started this morning. She denies UTI symptoms, N/V/D or constipation or vaginal bleeding. She has noted some discharge today. She denies LOF or recent intercourse. She reports +FM.   OB History    Gravida  6   Para  3   Term  3   Preterm      AB  2   Living  3     SAB  1   TAB      Ectopic      Multiple      Live Births  3           Past Medical History:  Diagnosis Date  . Depression    doing good now  . Gonorrhea   . Medical history non-contributory   . Pregnant   . SVD (spontaneous vaginal delivery)    x 3    Past Surgical History:  Procedure Laterality Date  . DILATION AND EVACUATION N/A 08/04/2018   Procedure: DILATATION AND EVACUATION - Ultrasound Guided;  Surgeon: Conan Bowensavis, Kelly M, MD;  Location: [facility];  Service: Gynecology;  Laterality: N/A;    Family History  Problem Relation Age of Onset  . Diabetes Mother   . Diabetes Father   . Hypertension Father     Social History   Tobacco Use  . Smoking status: Former Smoker    Packs/day: 0.50    Years: 5.00    Pack years: 2.50    Types: Cigarettes    Quit date: 06/11/2018    Years since quitting: 0.9  . Smokeless tobacco: Never Used  Substance Use Topics  . Alcohol use: Not Currently    Alcohol/week: 2.0 standard drinks    Types: 2 Shots of liquor per week  . Drug use: No    Allergies:  Allergies  Allergen Reactions  . Ampicillin Itching    Itching arm while antibiotic infusing Has patient had a PCN reaction causing immediate rash,  facial/tongue/throat swelling, SOB or lightheadedness with hypotension: Yes Has patient had a PCN reaction causing severe rash involving mucus membranes or skin necrosis: No Has patient had a PCN reaction that required hospitalization: No Has patient had a PCN reaction occurring within the last 10 years: Yes If all of the above answers are "NO", then may proceed with Cephalosporin use.     Medications Prior to Admission  Medication Sig Dispense Refill Last Dose  . Prenatal Vit-Fe Fumarate-FA (PRENATAL MULTIVITAMIN) TABS tablet Take 1 tablet by mouth daily at 12 noon.   06/03/2019 at Unknown time  . HYDROcodone-acetaminophen (NORCO/VICODIN) 5-325 MG tablet Take 1 tablet by mouth every 6 (six) hours as needed. 12 tablet 0     Review of Systems  Constitutional: Negative for fever.  Gastrointestinal: Negative for abdominal pain, constipation, diarrhea, nausea and vomiting.  Genitourinary: Negative for dysuria, frequency and urgency.   Physical Exam   Blood pressure (!) 103/59, pulse 97, temperature 98.6 F (37 C), temperature source Oral, resp. rate 18, weight  120.1 kg, last menstrual period 10/01/2018, SpO2 98 %, unknown if currently breastfeeding.  Physical Exam  Nursing note and vitals reviewed. Constitutional: She is oriented to person, place, and time. She appears well-developed and well-nourished. No distress.  HENT:  Head: Normocephalic and atraumatic.  Cardiovascular: Normal rate.  Respiratory: Effort normal.  GI: Soft. She exhibits no distension and no mass. There is abdominal tenderness (mild to moderate LLQ tenderness to palpation). There is no rebound and no guarding.  Genitourinary: Uterus is enlarged. Uterus is not tender. Cervix exhibits no motion tenderness, no discharge and no friability.    Vaginal discharge (small, white) present.     No vaginal bleeding.  No bleeding in the vagina.  Neurological: She is alert and oriented to person, place, and time.  Skin: Skin is  warm and dry. No erythema.  Psychiatric: She has a normal mood and affect.     Results for orders placed or performed during the hospital encounter of 06/04/19 (from the past 24 hour(s))  Urinalysis, Routine w reflex microscopic     Status: Abnormal   Collection Time: 06/04/19  6:23 PM  Result Value Ref Range   Color, Urine YELLOW YELLOW   APPearance CLOUDY (A) CLEAR   Specific Gravity, Urine 1.003 (L) 1.005 - 1.030   pH 7.0 5.0 - 8.0   Glucose, UA 50 (A) NEGATIVE mg/dL   Hgb urine dipstick NEGATIVE NEGATIVE   Bilirubin Urine NEGATIVE NEGATIVE   Ketones, ur NEGATIVE NEGATIVE mg/dL   Protein, ur NEGATIVE NEGATIVE mg/dL   Nitrite NEGATIVE NEGATIVE   Leukocytes,Ua TRACE (A) NEGATIVE   RBC / HPF 0-5 0 - 5 RBC/hpf   WBC, UA 6-10 0 - 5 WBC/hpf   Bacteria, UA MANY (A) NONE SEEN   Squamous Epithelial / LPF 21-50 0 - 5  Wet prep, genital     Status: Abnormal   Collection Time: 06/04/19  6:42 PM   Specimen: Vaginal  Result Value Ref Range   Yeast Wet Prep HPF POC NONE SEEN NONE SEEN   Trich, Wet Prep NONE SEEN NONE SEEN   Clue Cells Wet Prep HPF POC NONE SEEN NONE SEEN   WBC, Wet Prep HPF POC MANY (A) NONE SEEN   Sperm NONE SEEN     Fetal Monitoring: Baseline: 140 bpm Variability: moderate Accelerations: 10 x 10  Decelerations: none  Contractions: none    MAU Course  Procedures None   MDM UA, wet prep, GC/Chlamydia today  Urine culture ordered  Assessment and Plan  A: [facility] at [redacted]w[redacted]d Round ligament pain   P:  Discharge home Rx for Flexeril sent to patient's pharmacy  Rx for abdominal binder given to patient with information for [employer]  Preterm labor precautions and kick counts discussed Patient advised to follow-up with [facility] as scheduled for routine prenatal care or sooner PRN  Patient may return to MAU as needed or if her condition were to change or worsen  Kerry Hough, PA-C 06/04/2019, 7:21 PM

## 2019-06-06 LAB — CULTURE, OB URINE

## 2019-06-06 LAB — GC/CHLAMYDIA PROBE AMP (~~LOC~~) NOT AT ARMC
Chlamydia: NEGATIVE
Neisseria Gonorrhea: NEGATIVE

## 2019-06-11 DIAGNOSIS — Z3483 Encounter for supervision of other normal pregnancy, third trimester: Secondary | ICD-10-CM | POA: Diagnosis not present

## 2019-06-11 LAB — OB RESULTS CONSOLE GBS: GBS: NEGATIVE

## 2019-06-17 ENCOUNTER — Inpatient Hospital Stay (HOSPITAL_COMMUNITY)
Admission: AD | Admit: 2019-06-17 | Discharge: 2019-06-17 | Disposition: A | Discharge status: Home / Self Care | Payer: Medicaid Other | Attending: Obstetrics and Gynecology | Admitting: Obstetrics and Gynecology

## 2019-06-17 ENCOUNTER — Other Ambulatory Visit: Payer: Self-pay

## 2019-06-17 ENCOUNTER — Encounter (HOSPITAL_COMMUNITY): Payer: Self-pay

## 2019-06-17 DIAGNOSIS — Z3A36 36 weeks gestation of pregnancy: Secondary | ICD-10-CM

## 2019-06-17 DIAGNOSIS — O26893 Other specified pregnancy related conditions, third trimester: Secondary | ICD-10-CM | POA: Insufficient documentation

## 2019-06-17 DIAGNOSIS — R102 Pelvic and perineal pain: Secondary | ICD-10-CM | POA: Insufficient documentation

## 2019-06-17 DIAGNOSIS — O4703 False labor before 37 completed weeks of gestation, third trimester: Secondary | ICD-10-CM

## 2019-06-17 DIAGNOSIS — O479 False labor, unspecified: Secondary | ICD-10-CM

## 2019-06-17 LAB — URINALYSIS, ROUTINE W REFLEX MICROSCOPIC
Bacteria, UA: NONE SEEN
Bilirubin Urine: NEGATIVE
Glucose, UA: >=500 mg/dL — AB
Hgb urine dipstick: NEGATIVE
Ketones, ur: NEGATIVE mg/dL
Leukocytes,Ua: NEGATIVE
Nitrite: NEGATIVE
Protein, ur: NEGATIVE mg/dL
Specific Gravity, Urine: 1.013 (ref 1.005–1.030)
pH: 6 (ref 5.0–8.0)

## 2019-06-17 MED ORDER — NIFEDIPINE 10 MG PO CAPS
10.0000 mg | ORAL_CAPSULE | Freq: Once | ORAL | Status: AC
  Administered 2019-06-17: 10 mg via ORAL
  Filled 2019-06-17: qty 1

## 2019-06-17 MED ORDER — ONDANSETRON 4 MG PO TBDP
8.0000 mg | ORAL_TABLET | Freq: Once | ORAL | Status: AC
  Administered 2019-06-17: 12:00:00 8 mg via ORAL
  Filled 2019-06-17: qty 2

## 2019-06-17 NOTE — MAU Note (Signed)
Wendy Atkins is a 28 y.o. at [redacted]w[redacted]d here in MAU reporting: when she is standing she is having a lot of pelvic pressure and she feels like she could push. Denies contractions. States the pressure has been ongoing for a few days but is progressively getting worse. No bleeding, no LOF. +FM. Started having nausea this morning, has not vomited. Was 1 cm last week.   Onset of complaint: ongoing  Pain score: 9/10  Vitals:   06/17/19 1055  BP: 108/69  Pulse: (!) 110  Resp: 16  Temp: 98.1 F (36.7 C)  SpO2: 98%     FHT: +FM  Lab orders placed from triage: UA

## 2019-06-17 NOTE — Discharge Instructions (Signed)
Braxton Hicks Contractions Contractions of the uterus can occur throughout pregnancy, but they are not always a sign that you are in labor. You may have practice contractions called Braxton Hicks contractions. These false labor contractions are sometimes confused with true labor. What are Braxton Hicks contractions? Braxton Hicks contractions are tightening movements that occur in the muscles of the uterus before labor. Unlike true labor contractions, these contractions do not result in opening (dilation) and thinning of the cervix. Toward the end of pregnancy (32-34 weeks), Braxton Hicks contractions can happen more often and may become stronger. These contractions are sometimes difficult to tell apart from true labor because they can be very uncomfortable. You should not feel embarrassed if you go to the hospital with false labor. Sometimes, the only way to tell if you are in true labor is for your health care provider to look for changes in the cervix. The health care provider will do a physical exam and may monitor your contractions. If you are not in true labor, the exam should show that your cervix is not dilating and your water has not broken. If there are no other health problems associated with your pregnancy, it is completely safe for you to be sent home with false labor. You may continue to have Braxton Hicks contractions until you go into true labor. How to tell the difference between true labor and false labor True labor  Contractions last 30-70 seconds.  Contractions become very regular.  Discomfort is usually felt in the top of the uterus, and it spreads to the lower abdomen and low back.  Contractions do not go away with walking.  Contractions usually become more intense and increase in frequency.  The cervix dilates and gets thinner. False labor  Contractions are usually shorter and not as strong as true labor contractions.  Contractions are usually irregular.  Contractions  are often felt in the front of the lower abdomen and in the groin.  Contractions may go away when you walk around or change positions while lying down.  Contractions get weaker and are shorter-lasting as time goes on.  The cervix usually does not dilate or become thin. Follow these instructions at home:   Take over-the-counter and prescription medicines only as told by your health care provider.  Keep up with your usual exercises and follow other instructions from your health care provider.  Eat and drink lightly if you think you are going into labor.  If Braxton Hicks contractions are making you uncomfortable: ? Change your position from lying down or resting to walking, or change from walking to resting. ? Sit and rest in a tub of warm water. ? Drink enough fluid to keep your urine pale yellow. Dehydration may cause these contractions. ? Do slow and deep breathing several times an hour.  Keep all follow-up prenatal visits as told by your health care provider. This is important. Contact a health care provider if:  You have a fever.  You have continuous pain in your abdomen. Get help right away if:  Your contractions become stronger, more regular, and closer together.  You have fluid leaking or gushing from your vagina.  You pass blood-tinged mucus (bloody show).  You have bleeding from your vagina.  You have low back pain that you never had before.  You feel your baby's head pushing down and causing pelvic pressure.  Your baby is not moving inside you as much as it used to. Summary  Contractions that occur before labor are   called Braxton Hicks contractions, false labor, or practice contractions.  Braxton Hicks contractions are usually shorter, weaker, farther apart, and less regular than true labor contractions. True labor contractions usually become progressively stronger and regular, and they become more frequent.  Manage discomfort from Braxton Hicks contractions  by changing position, resting in a warm bath, drinking plenty of water, or practicing deep breathing. This information is not intended to replace advice given to you by your health care provider. Make sure you discuss any questions you have with your health care provider. Document Released: 01/27/2017 Document Revised: 08/26/2017 Document Reviewed: 01/27/2017 Elsevier Patient Education  2020 Elsevier Inc.  

## 2019-06-17 NOTE — MAU Provider Note (Signed)
First Provider Initiated Contact with Patient 06/17/19 1200   S: Ms. Aminta Sakurai is a 28 y.o. [phone] at [redacted]w[redacted]d  who presents to MAU today complaining contractions and pressure for 1 week. Says the pressure became worse today. The pressure is worse when she is up walking around. No bleeding. Was prescribed a pregnancy support belt, however is not wearing it. She denies vaginal bleeding. She denies LOF. She reports normal fetal movement.    O: BP 108/69 (BP Location: Left Arm)   Pulse (!) 110   Temp 98.1 F (36.7 C) (Oral)   Resp 16   LMP 10/01/2018   SpO2 98%  GENERAL: Well-developed, well-nourished female in no acute distress.  HEAD: Normocephalic, atraumatic.  CHEST: Normal effort of breathing, regular heart rate ABDOMEN: Soft, nontender, gravid  Cervical exam:  Dilation: 1 Effacement (%): 50 Cervical Position: Posterior Presentation: Undeterminable Exam by:: Fredda Hammed RN    Fetal Monitoring: Baseline: 150 bpm Variability: moderate  Accelerations: 15x15 Decelerations: variable  Contractions: None  MDM: Procardia given 1 dose 10 mg. Patient feels better following procardia   Results for orders placed or performed during the hospital encounter of 06/17/19 (from the past 48 hour(s))  Urinalysis, Routine w reflex microscopic     Status: Abnormal   Collection Time: 06/17/19 11:18 AM  Result Value Ref Range   Color, Urine YELLOW YELLOW   APPearance HAZY (A) CLEAR   Specific Gravity, Urine 1.013 1.005 - 1.030   pH 6.0 5.0 - 8.0   Glucose, UA >=500 (A) NEGATIVE mg/dL   Hgb urine dipstick NEGATIVE NEGATIVE   Bilirubin Urine NEGATIVE NEGATIVE   Ketones, ur NEGATIVE NEGATIVE mg/dL   Protein, ur NEGATIVE NEGATIVE mg/dL   Nitrite NEGATIVE NEGATIVE   Leukocytes,Ua NEGATIVE NEGATIVE   RBC / HPF 0-5 0 - 5 RBC/hpf   WBC, UA 0-5 0 - 5 WBC/hpf   Bacteria, UA NONE SEEN NONE SEEN   Squamous Epithelial / LPF 11-20 0 - 5   Mucus PRESENT     Comment: Performed at [facility], [address] [place], [place] [address]    A: [facility] at [redacted]w[redacted]d  False labor  Pelvic pressure at [redacted] weeks gestation Round ligament pain   P: Change positions slowly Encouraged pregnancy support belt Increase oral fluid intake  Return to MAU if symptoms worsen Fetal kick counts    Blen Ransome, Artist Pais, NP 06/17/2019 5:22 PM

## 2019-06-25 ENCOUNTER — Inpatient Hospital Stay (HOSPITAL_COMMUNITY)
Admission: AD | Admit: 2019-06-25 | Discharge: 2019-06-25 | Disposition: A | Discharge status: Home / Self Care | Payer: Medicaid Other | Attending: Obstetrics and Gynecology | Admitting: Obstetrics and Gynecology

## 2019-06-25 ENCOUNTER — Encounter (HOSPITAL_COMMUNITY): Payer: Self-pay

## 2019-06-25 DIAGNOSIS — Z880 Allergy status to penicillin: Secondary | ICD-10-CM | POA: Diagnosis not present

## 2019-06-25 DIAGNOSIS — O368330 Maternal care for abnormalities of the fetal heart rate or rhythm, third trimester, not applicable or unspecified: Secondary | ICD-10-CM | POA: Insufficient documentation

## 2019-06-25 DIAGNOSIS — O2343 Unspecified infection of urinary tract in pregnancy, third trimester: Secondary | ICD-10-CM | POA: Diagnosis not present

## 2019-06-25 DIAGNOSIS — Z3A38 38 weeks gestation of pregnancy: Secondary | ICD-10-CM | POA: Insufficient documentation

## 2019-06-25 DIAGNOSIS — O368130 Decreased fetal movements, third trimester, not applicable or unspecified: Secondary | ICD-10-CM

## 2019-06-25 DIAGNOSIS — Z3689 Encounter for other specified antenatal screening: Secondary | ICD-10-CM

## 2019-06-25 LAB — URINALYSIS, ROUTINE W REFLEX MICROSCOPIC
Bilirubin Urine: NEGATIVE
Glucose, UA: 150 mg/dL — AB
Hgb urine dipstick: NEGATIVE
Ketones, ur: NEGATIVE mg/dL
Nitrite: NEGATIVE
Protein, ur: 30 mg/dL — AB
Specific Gravity, Urine: 1.028 (ref 1.005–1.030)
pH: 6 (ref 5.0–8.0)

## 2019-06-25 MED ORDER — CEPHALEXIN 500 MG PO CAPS: 500.0000 mg | ORAL_CAPSULE | Freq: Four times a day (QID) | ORAL | 0 refills | Status: DC

## 2019-06-25 NOTE — Discharge Instructions (Signed)
Pregnancy and Urinary Tract Infection ° °A urinary tract infection (UTI) is an infection of any part of the urinary tract. This includes the kidneys, the tubes that connect your kidneys to your bladder (ureters), the bladder, and the tube that carries urine out of your body (urethra). These organs make, store, and get rid of urine in the body. Your health care provider may use other names to describe the infection. An upper UTI affects the ureters and kidneys (pyelonephritis). A lower UTI affects the bladder (cystitis) and urethra (urethritis). °Most urinary tract infections are caused by bacteria in your genital area, around the entrance to your urinary tract (urethra). These bacteria grow and cause irritation and inflammation of your urinary tract. You are more likely to develop a UTI during pregnancy because the physical and hormonal changes your body goes through can make it easier for bacteria to get into your urinary tract. Your growing baby also puts pressure on your bladder and can affect urine flow. It is important to recognize and treat UTIs in pregnancy because of the risk of serious complications for both you and your baby. °How does this affect me? °Symptoms of a UTI include: °· Needing to urinate right away (urgently). °· Frequent urination or passing small amounts of urine frequently. °· Pain or burning with urination. °· Blood in the urine. °· Urine that smells bad or unusual. °· Trouble urinating. °· Cloudy urine. °· Pain in the abdomen or lower back. °· Vaginal discharge. °You may also have: °· Vomiting or a decreased appetite. °· Confusion. °· Irritability or tiredness. °· A fever. °· Diarrhea. °How does this affect my baby? °An untreated UTI during pregnancy could lead to a kidney infection or a systemic infection, which can cause health problems that could affect your baby. Possible complications of an untreated UTI include: °· Giving birth to your baby before 37 weeks of pregnancy  (premature). °· Having a baby with a low birth weight. °· Developing high blood pressure during pregnancy (preeclampsia). °· Having a low hemoglobin level (anemia). °What can I do to lower my risk? °To prevent a UTI: °· Go to the bathroom as soon as you feel the need. Do not hold urine for long periods of time. °· Always wipe from front to back, especially after a bowel movement. Use each tissue one time when you wipe. °· Empty your bladder after sex. °· Keep your genital area dry. °· Drink 6-10 glasses of water each day. °· Do not douche or use deodorant sprays. °How is this treated? °Treatment for this condition may include: °· Antibiotic medicines that are safe to take during pregnancy. °· Other medicines to treat less common causes of UTI. °Follow these instructions at home: °· If you were prescribed an antibiotic medicine, take it as told by your health care provider. Do not stop using the antibiotic even if you start to feel better. °· Keep all follow-up visits as told by your health care provider. This is important. °Contact a health care provider if: °· Your symptoms do not improve or they get worse. °· You have abnormal vaginal discharge. °Get help right away if you: °· Have a fever. °· Have nausea and vomiting. °· Have back or side pain. °· Feel contractions in your uterus. °· Have lower belly pain. °· Have a gush of fluid from your vagina. °· Have blood in your urine. °Summary °· A urinary tract infection (UTI) is an infection of any part of the urinary tract, which includes the   kidneys, ureters, bladder, and urethra. °· Most urinary tract infections are caused by bacteria in your genital area, around the entrance to your urinary tract (urethra). °· You are more likely to develop a UTI during pregnancy. °· If you were prescribed an antibiotic medicine, take it as told by your health care provider. Do not stop using the antibiotic even if you start to feel better. °This information is not intended to  replace advice given to you by your health care provider. Make sure you discuss any questions you have with your health care provider. °Document Released: 01/08/2011 Document Revised: 01/05/2019 Document Reviewed: 08/17/2018 °Elsevier Patient Education © 2020 [employer] Inc. ° °

## 2019-06-25 NOTE — MAU Note (Addendum)
Pt was sent from [place] [place] for eval d/t non reactive NST, decreased FM, fetal tachycardia and nausea. Had trace protein in urine.

## 2019-06-25 NOTE — MAU Provider Note (Signed)
History     CSN: [id number]  Arrival date and time: 06/25/19 1603   First Provider Initiated Contact with Patient 06/25/19 1743      Chief Complaint  Patient presents with  . Decreased Fetal Movement  . Nausea   HPI    [name] is a 28 y.o. female [phone] @ [redacted]w[redacted]d here in MAU sent from the HD d/t fetal tachycardia noted on fetal tracing. She had complained about DFM, however since her arrival to MAU she has felt the baby move normally. She has had occasional left lower/upper back pain over the last week. The pain comes and goes. No history of kidney stones.  No bleeding.   OB History    Gravida  6   Para  3   Term  3   Preterm      AB  2   Living  3     SAB  1   TAB      Ectopic      Multiple      Live Births  3           Past Medical History:  Diagnosis Date  . Depression    doing good now  . Gonorrhea   . Medical history non-contributory   . Pregnant   . SVD (spontaneous vaginal delivery)    x 3    Past Surgical History:  Procedure Laterality Date  . DILATION AND EVACUATION N/A 08/04/2018   Procedure: DILATATION AND EVACUATION - Ultrasound Guided;  Surgeon: Conan Bowens, MD;  Location: [facility];  Service: Gynecology;  Laterality: N/A;    Family History  Problem Relation Age of Onset  . Diabetes Mother   . Diabetes Father   . Hypertension Father     Social History   Tobacco Use  . Smoking status: Former Smoker    Packs/day: 0.50    Years: 5.00    Pack years: 2.50    Types: Cigarettes    Quit date: 06/11/2018    Years since quitting: 1.0  . Smokeless tobacco: Never Used  Substance Use Topics  . Alcohol use: Not Currently    Alcohol/week: 2.0 standard drinks    Types: 2 Shots of liquor per week  . Drug use: No    Allergies:  Allergies  Allergen Reactions  . Ampicillin Itching    Itching arm while antibiotic infusing Has patient had a PCN reaction causing immediate rash, facial/tongue/throat swelling, SOB or  lightheadedness with hypotension: Yes Has patient had a PCN reaction causing severe rash involving mucus membranes or skin necrosis: No Has patient had a PCN reaction that required hospitalization: No Has patient had a PCN reaction occurring within the last 10 years: Yes If all of the above answers are "NO", then may proceed with Cephalosporin use.     Medications Prior to Admission  Medication Sig Dispense Refill Last Dose  . cyclobenzaprine (FLEXERIL) 10 MG tablet Take 1 tablet (10 mg total) by mouth 2 (two) times daily as needed for muscle spasms. 20 tablet 0   . Elastic Bandages & Supports (COMFORT FIT MATERNITY SUPP LG) MISC 1 Units by Does not apply route daily as needed. 1 each 0   . Prenatal Vit-Fe Fumarate-FA (PRENATAL MULTIVITAMIN) TABS tablet Take 1 tablet by mouth daily at 12 noon.      Results for orders placed or performed during the hospital encounter of 06/25/19 (from the past 48 hour(s))  Urinalysis, Routine w reflex microscopic     Status:  Abnormal   Collection Time: 06/25/19  5:00 PM  Result Value Ref Range   Color, Urine YELLOW YELLOW   APPearance CLOUDY (A) CLEAR   Specific Gravity, Urine 1.028 1.005 - 1.030   pH 6.0 5.0 - 8.0   Glucose, UA 150 (A) NEGATIVE mg/dL   Hgb urine dipstick NEGATIVE NEGATIVE   Bilirubin Urine NEGATIVE NEGATIVE   Ketones, ur NEGATIVE NEGATIVE mg/dL   Protein, ur 30 (A) NEGATIVE mg/dL   Nitrite NEGATIVE NEGATIVE   Leukocytes,Ua MODERATE (A) NEGATIVE   RBC / HPF 0-5 0 - 5 RBC/hpf   WBC, UA 0-5 0 - 5 WBC/hpf   Bacteria, UA FEW (A) NONE SEEN   Squamous Epithelial / LPF 6-10 0 - 5   Mucus PRESENT    Ca Oxalate Crys, UA PRESENT     Comment: Performed at [facility] [address] [place], [place] [address]   Review of Systems  Constitutional: Negative for fever.  Genitourinary: Positive for frequency. Negative for dysuria.  Musculoskeletal: Positive for back pain.   Physical Exam   Blood pressure 115/73, pulse (!) 106,  temperature 98.1 F (36.7 C), temperature source Oral, resp. rate 18, height 5\' 8"  (1.727 m), weight 120.4 kg, last menstrual period 10/01/2018, SpO2 96 %, unknown if currently breastfeeding.  Physical Exam  Constitutional: She is oriented to person, place, and time. She appears well-developed and well-nourished. No distress.  GI: Soft. She exhibits no distension. There is no abdominal tenderness. There is no rebound, no guarding and no CVA tenderness.  Musculoskeletal: Normal range of motion.       Arms:  Neurological: She is alert and oriented to person, place, and time.  Skin: Skin is warm. She is not diaphoretic.  Psychiatric: Her behavior is normal.   Fetal Tracing: Baseline: 145 bpm Variability: Moderate  Accelerations: 15x15 Decelerations: None Toco: None  MAU Course  Procedures  None  MDM  UA shows moderate leuks, Ca Oxalate Crystals, patient with lower back pain x 1 week. Will treat for UTI. Urine culture pending.  + Fetal movement, no fetal tachycardia.   Assessment and Plan   A:  1. NST (non-stress test) reactive   2. Decreased fetal movements in third trimester, single or unspecified fetus   3. UTI in pregnancy, antepartum, third trimester     P:  Discharge home in stable condition Return to MAU if symptoms worsen Rx: Keflex Urine culture pending Fetal kick counts reviewed.  Noni Saupe I, NP 06/27/2019 8:15 AM

## 2019-06-27 LAB — CULTURE, OB URINE: Special Requests: NORMAL

## 2019-07-06 ENCOUNTER — Inpatient Hospital Stay (HOSPITAL_COMMUNITY)
Admission: AD | Admit: 2019-07-06 | Discharge: 2019-07-06 | Disposition: A | Discharge status: Home / Self Care | Payer: Medicaid Other | Attending: Obstetrics and Gynecology | Admitting: Obstetrics and Gynecology

## 2019-07-06 ENCOUNTER — Encounter (HOSPITAL_COMMUNITY): Payer: Self-pay

## 2019-07-06 DIAGNOSIS — O4703 False labor before 37 completed weeks of gestation, third trimester: Secondary | ICD-10-CM

## 2019-07-06 DIAGNOSIS — Z3A39 39 weeks gestation of pregnancy: Secondary | ICD-10-CM | POA: Diagnosis not present

## 2019-07-06 DIAGNOSIS — O471 False labor at or after 37 completed weeks of gestation: Secondary | ICD-10-CM | POA: Diagnosis not present

## 2019-07-06 DIAGNOSIS — O479 False labor, unspecified: Secondary | ICD-10-CM

## 2019-07-06 NOTE — Discharge Instructions (Signed)
Braxton Hicks Contractions Contractions of the uterus can occur throughout pregnancy, but they are not always a sign that you are in labor. You may have practice contractions called Braxton Hicks contractions. These false labor contractions are sometimes confused with true labor. What are Braxton Hicks contractions? Braxton Hicks contractions are tightening movements that occur in the muscles of the uterus before labor. Unlike true labor contractions, these contractions do not result in opening (dilation) and thinning of the cervix. Toward the end of pregnancy (32-34 weeks), Braxton Hicks contractions can happen more often and may become stronger. These contractions are sometimes difficult to tell apart from true labor because they can be very uncomfortable. You should not feel embarrassed if you go to the hospital with false labor. Sometimes, the only way to tell if you are in true labor is for your health care provider to look for changes in the cervix. The health care provider will do a physical exam and may monitor your contractions. If you are not in true labor, the exam should show that your cervix is not dilating and your water has not broken. If there are no other health problems associated with your pregnancy, it is completely safe for you to be sent home with false labor. You may continue to have Braxton Hicks contractions until you go into true labor. How to tell the difference between true labor and false labor True labor  Contractions last 30-70 seconds.  Contractions become very regular.  Discomfort is usually felt in the top of the uterus, and it spreads to the lower abdomen and low back.  Contractions do not go away with walking.  Contractions usually become more intense and increase in frequency.  The cervix dilates and gets thinner. False labor  Contractions are usually shorter and not as strong as true labor contractions.  Contractions are usually irregular.  Contractions  are often felt in the front of the lower abdomen and in the groin.  Contractions may go away when you walk around or change positions while lying down.  Contractions get weaker and are shorter-lasting as time goes on.  The cervix usually does not dilate or become thin. Follow these instructions at home:   Take over-the-counter and prescription medicines only as told by your health care provider.  Keep up with your usual exercises and follow other instructions from your health care provider.  Eat and drink lightly if you think you are going into labor.  If Braxton Hicks contractions are making you uncomfortable: ? Change your position from lying down or resting to walking, or change from walking to resting. ? Sit and rest in a tub of warm water. ? Drink enough fluid to keep your urine pale yellow. Dehydration may cause these contractions. ? Do slow and deep breathing several times an hour.  Keep all follow-up prenatal visits as told by your health care provider. This is important. Contact a health care provider if:  You have a fever.  You have continuous pain in your abdomen. Get help right away if:  Your contractions become stronger, more regular, and closer together.  You have fluid leaking or gushing from your vagina.  You pass blood-tinged mucus (bloody show).  You have bleeding from your vagina.  You have low back pain that you never had before.  You feel your baby's head pushing down and causing pelvic pressure.  Your baby is not moving inside you as much as it used to. Summary  Contractions that occur before labor are   called Braxton Hicks contractions, false labor, or practice contractions.  Braxton Hicks contractions are usually shorter, weaker, farther apart, and less regular than true labor contractions. True labor contractions usually become progressively stronger and regular, and they become more frequent.  Manage discomfort from Braxton Hicks contractions  by changing position, resting in a warm bath, drinking plenty of water, or practicing deep breathing. This information is not intended to replace advice given to you by your health care provider. Make sure you discuss any questions you have with your health care provider. Document Released: 01/27/2017 Document Revised: 08/26/2017 Document Reviewed: 01/27/2017 Elsevier Patient Education  2020 Elsevier Inc.  

## 2019-07-06 NOTE — MAU Provider Note (Signed)
S: Ms. Ralynn San is a 28 y.o. [phone] at [redacted]w[redacted]d  who presents to MAU today for labor evaluation.     Cervical exam by RN:  Dilation: 2 Effacement (%): Thick Cervical Position: Posterior Exam by:: Coolidge Breeze, RN  Fetal Monitoring: Baseline: 140 bpm Variability: moderate Accelerations: 15 x 15 Decelerations: none Contractions: few, irregular   MDM Discussed patient with RN. NST reviewed.   A: SIUP at [redacted]w[redacted]d  False labor  P: Discharge home Labor precautions and kick counts included in AVS Patient to follow-up with [facility] as scheduled  Patient may return to MAU as needed or when in labor   Luvenia Redden, [place] 07/06/2019 6:03 PM

## 2019-07-06 NOTE — MAU Note (Signed)
Pt presents to MAU with c/o ctx that started around 3 pm. She denies VB and LOF, +FM. Was checked on Monday and was 1.5 cm.

## 2019-07-10 ENCOUNTER — Encounter (HOSPITAL_COMMUNITY): Payer: Self-pay | Admitting: *Deleted

## 2019-07-10 ENCOUNTER — Telehealth (HOSPITAL_COMMUNITY): Payer: Self-pay | Admitting: *Deleted

## 2019-07-10 DIAGNOSIS — O480 Post-term pregnancy: Secondary | ICD-10-CM | POA: Diagnosis not present

## 2019-07-10 NOTE — Telephone Encounter (Signed)
Preadmission screen  

## 2019-07-13 ENCOUNTER — Other Ambulatory Visit: Payer: Self-pay

## 2019-07-13 ENCOUNTER — Other Ambulatory Visit: Payer: Self-pay | Admitting: Family Medicine

## 2019-07-13 ENCOUNTER — Other Ambulatory Visit (HOSPITAL_COMMUNITY)
Admission: RE | Admit: 2019-07-13 | Discharge: 2019-07-13 | Disposition: A | Discharge status: Home / Self Care | Payer: Medicaid Other | Source: Ambulatory Visit | Attending: Obstetrics and Gynecology | Admitting: Obstetrics and Gynecology

## 2019-07-13 DIAGNOSIS — Z01812 Encounter for preprocedural laboratory examination: Secondary | ICD-10-CM | POA: Diagnosis not present

## 2019-07-13 DIAGNOSIS — Z20828 Contact with and (suspected) exposure to other viral communicable diseases: Secondary | ICD-10-CM | POA: Diagnosis not present

## 2019-07-13 LAB — SARS CORONAVIRUS 2 (TAT 6-24 HRS): SARS Coronavirus 2: NEGATIVE

## 2019-07-13 NOTE — MAU Note (Signed)
Stuffy nose, swab collected.

## 2019-07-15 ENCOUNTER — Other Ambulatory Visit: Payer: Self-pay

## 2019-07-15 ENCOUNTER — Encounter (HOSPITAL_COMMUNITY): Payer: Self-pay

## 2019-07-15 ENCOUNTER — Inpatient Hospital Stay (HOSPITAL_COMMUNITY)
Admission: AD | Admit: 2019-07-15 | Discharge: 2019-07-17 | DRG: 807 | Disposition: A | Discharge status: Home / Self Care | Payer: Medicaid Other | Attending: Family Medicine | Admitting: Family Medicine

## 2019-07-15 ENCOUNTER — Inpatient Hospital Stay (HOSPITAL_COMMUNITY): Payer: Medicaid Other | Admitting: Anesthesiology

## 2019-07-15 ENCOUNTER — Inpatient Hospital Stay (HOSPITAL_COMMUNITY): Payer: Medicaid Other

## 2019-07-15 DIAGNOSIS — Z3A41 41 weeks gestation of pregnancy: Secondary | ICD-10-CM

## 2019-07-15 DIAGNOSIS — O99214 Obesity complicating childbirth: Secondary | ICD-10-CM | POA: Diagnosis present

## 2019-07-15 DIAGNOSIS — O480 Post-term pregnancy: Secondary | ICD-10-CM | POA: Diagnosis not present

## 2019-07-15 DIAGNOSIS — Z87891 Personal history of nicotine dependence: Secondary | ICD-10-CM

## 2019-07-15 DIAGNOSIS — Z3009 Encounter for other general counseling and advice on contraception: Secondary | ICD-10-CM | POA: Diagnosis present

## 2019-07-15 DIAGNOSIS — Z3490 Encounter for supervision of normal pregnancy, unspecified, unspecified trimester: Secondary | ICD-10-CM | POA: Diagnosis present

## 2019-07-15 LAB — ABO/RH: ABO/RH(D): B POS

## 2019-07-15 LAB — OB RESULTS CONSOLE RPR: RPR: NONREACTIVE

## 2019-07-15 LAB — CBC
HCT: 34 % — ABNORMAL LOW (ref 36.0–46.0)
Hemoglobin: 10.5 g/dL — ABNORMAL LOW (ref 12.0–15.0)
MCH: 27.4 pg (ref 26.0–34.0)
MCHC: 30.9 g/dL (ref 30.0–36.0)
MCV: 88.8 fL (ref 80.0–100.0)
Platelets: 250 10*3/uL (ref 150–400)
RBC: 3.83 MIL/uL — ABNORMAL LOW (ref 3.87–5.11)
RDW: 16.1 % — ABNORMAL HIGH (ref 11.5–15.5)
WBC: 11 10*3/uL — ABNORMAL HIGH (ref 4.0–10.5)
nRBC: 0 % (ref 0.0–0.2)

## 2019-07-15 LAB — TYPE AND SCREEN
ABO/RH(D): B POS
Antibody Screen: NEGATIVE

## 2019-07-15 LAB — RPR: RPR Ser Ql: NONREACTIVE

## 2019-07-15 MED ORDER — SOD CITRATE-CITRIC ACID 500-334 MG/5ML PO SOLN: 30.0000 mL | ORAL | Status: DC | PRN

## 2019-07-15 MED ORDER — LIDOCAINE HCL (PF) 1 % IJ SOLN
INTRAMUSCULAR | Status: DC | PRN
  Administered 2019-07-15 (×2): 4 mL via EPIDURAL
  Administered 2019-07-15: 2 mL via EPIDURAL

## 2019-07-15 MED ORDER — OXYTOCIN 40 UNITS IN NORMAL SALINE INFUSION - SIMPLE MED
1.0000 m[IU]/min | INTRAVENOUS | Status: DC
  Administered 2019-07-15: 2 m[IU]/min via INTRAVENOUS

## 2019-07-15 MED ORDER — FENTANYL-BUPIVACAINE-NACL 0.5-0.125-0.9 MG/250ML-% EP SOLN
12.0000 mL/h | EPIDURAL | Status: DC | PRN
  Filled 2019-07-15: qty 250

## 2019-07-15 MED ORDER — DIPHENHYDRAMINE HCL 25 MG PO CAPS: 25.0000 mg | ORAL_CAPSULE | Freq: Four times a day (QID) | ORAL | Status: DC | PRN

## 2019-07-15 MED ORDER — BENZOCAINE-MENTHOL 20-0.5 % EX AERO
1.0000 "application " | INHALATION_SPRAY | CUTANEOUS | Status: DC | PRN
  Administered 2019-07-15: 1 via TOPICAL
  Filled 2019-07-15: qty 56

## 2019-07-15 MED ORDER — EPHEDRINE 5 MG/ML INJ: 10.0000 mg | INTRAVENOUS | Status: DC | PRN

## 2019-07-15 MED ORDER — TETANUS-DIPHTH-ACELL PERTUSSIS 5-2.5-18.5 LF-MCG/0.5 IM SUSP: 0.5000 mL | Freq: Once | INTRAMUSCULAR | Status: DC

## 2019-07-15 MED ORDER — COCONUT OIL OIL: 1.0000 "application " | TOPICAL_OIL | Status: DC | PRN

## 2019-07-15 MED ORDER — DIPHENHYDRAMINE HCL 50 MG/ML IJ SOLN: 12.5000 mg | INTRAMUSCULAR | Status: DC | PRN

## 2019-07-15 MED ORDER — LIDOCAINE HCL (PF) 1 % IJ SOLN: 30.0000 mL | INTRAMUSCULAR | Status: DC | PRN

## 2019-07-15 MED ORDER — TERBUTALINE SULFATE 1 MG/ML IJ SOLN: 0.2500 mg | Freq: Once | INTRAMUSCULAR | Status: DC | PRN

## 2019-07-15 MED ORDER — WITCH HAZEL-GLYCERIN EX PADS: 1.0000 "application " | MEDICATED_PAD | CUTANEOUS | Status: DC | PRN

## 2019-07-15 MED ORDER — SIMETHICONE 80 MG PO CHEW: 80.0000 mg | CHEWABLE_TABLET | ORAL | Status: DC | PRN

## 2019-07-15 MED ORDER — ONDANSETRON HCL 4 MG/2ML IJ SOLN: 4.0000 mg | INTRAMUSCULAR | Status: DC | PRN

## 2019-07-15 MED ORDER — MISOPROSTOL 25 MCG QUARTER TABLET
25.0000 ug | ORAL_TABLET | Freq: Once | ORAL | Status: AC
  Administered 2019-07-15: 25 ug via VAGINAL

## 2019-07-15 MED ORDER — PRENATAL MULTIVITAMIN CH
1.0000 | ORAL_TABLET | Freq: Every day | ORAL | Status: DC
  Administered 2019-07-16: 11:00:00 1 via ORAL
  Filled 2019-07-15: qty 1

## 2019-07-15 MED ORDER — MISOPROSTOL 25 MCG QUARTER TABLET
ORAL_TABLET | ORAL | Status: AC
  Filled 2019-07-15: qty 1

## 2019-07-15 MED ORDER — PHENYLEPHRINE 40 MCG/ML (10ML) SYRINGE FOR IV PUSH (FOR BLOOD PRESSURE SUPPORT): 80.0000 ug | PREFILLED_SYRINGE | INTRAVENOUS | Status: DC | PRN

## 2019-07-15 MED ORDER — ZOLPIDEM TARTRATE 5 MG PO TABS: 5.0000 mg | ORAL_TABLET | Freq: Every evening | ORAL | Status: DC | PRN

## 2019-07-15 MED ORDER — OXYCODONE-ACETAMINOPHEN 5-325 MG PO TABS: 2.0000 | ORAL_TABLET | ORAL | Status: DC | PRN

## 2019-07-15 MED ORDER — SODIUM CHLORIDE (PF) 0.9 % IJ SOLN
INTRAMUSCULAR | Status: DC | PRN
  Administered 2019-07-15: 12 mL/h via EPIDURAL

## 2019-07-15 MED ORDER — IBUPROFEN 600 MG PO TABS
600.0000 mg | ORAL_TABLET | Freq: Four times a day (QID) | ORAL | Status: DC
  Administered 2019-07-15 – 2019-07-17 (×6): 600 mg via ORAL
  Filled 2019-07-15 (×6): qty 1

## 2019-07-15 MED ORDER — SENNOSIDES-DOCUSATE SODIUM 8.6-50 MG PO TABS
2.0000 | ORAL_TABLET | ORAL | Status: DC
  Administered 2019-07-15 – 2019-07-16 (×2): 2 via ORAL
  Filled 2019-07-15 (×2): qty 2

## 2019-07-15 MED ORDER — OXYTOCIN 40 UNITS IN NORMAL SALINE INFUSION - SIMPLE MED
2.5000 [IU]/h | INTRAVENOUS | Status: DC
  Filled 2019-07-15: qty 1000

## 2019-07-15 MED ORDER — PHENYLEPHRINE 40 MCG/ML (10ML) SYRINGE FOR IV PUSH (FOR BLOOD PRESSURE SUPPORT)
80.0000 ug | PREFILLED_SYRINGE | INTRAVENOUS | Status: DC | PRN
  Filled 2019-07-15: qty 10

## 2019-07-15 MED ORDER — ONDANSETRON HCL 4 MG PO TABS: 4.0000 mg | ORAL_TABLET | ORAL | Status: DC | PRN

## 2019-07-15 MED ORDER — OXYTOCIN BOLUS FROM INFUSION
500.0000 mL | Freq: Once | INTRAVENOUS | Status: AC
  Administered 2019-07-15: 19:00:00 500 mL via INTRAVENOUS

## 2019-07-15 MED ORDER — ONDANSETRON HCL 4 MG/2ML IJ SOLN: 4.0000 mg | Freq: Four times a day (QID) | INTRAMUSCULAR | Status: DC | PRN

## 2019-07-15 MED ORDER — DIBUCAINE (PERIANAL) 1 % EX OINT: 1.0000 "application " | TOPICAL_OINTMENT | CUTANEOUS | Status: DC | PRN

## 2019-07-15 MED ORDER — LACTATED RINGERS IV SOLN
500.0000 mL | INTRAVENOUS | Status: DC | PRN
  Administered 2019-07-15 (×3): 500 mL via INTRAVENOUS

## 2019-07-15 MED ORDER — ACETAMINOPHEN 325 MG PO TABS
650.0000 mg | ORAL_TABLET | ORAL | Status: DC | PRN
  Administered 2019-07-16 (×4): 650 mg via ORAL
  Filled 2019-07-15 (×4): qty 2

## 2019-07-15 MED ORDER — ACETAMINOPHEN 325 MG PO TABS: 650.0000 mg | ORAL_TABLET | ORAL | Status: DC | PRN

## 2019-07-15 MED ORDER — OXYCODONE-ACETAMINOPHEN 5-325 MG PO TABS: 1.0000 | ORAL_TABLET | ORAL | Status: DC | PRN

## 2019-07-15 MED ORDER — LACTATED RINGERS IV SOLN
INTRAVENOUS | Status: DC
  Administered 2019-07-15 (×2): via INTRAVENOUS

## 2019-07-15 MED ORDER — MISOPROSTOL 50MCG HALF TABLET
50.0000 ug | ORAL_TABLET | Freq: Once | ORAL | Status: AC
  Administered 2019-07-15: 06:00:00 50 ug via BUCCAL
  Filled 2019-07-15: qty 1

## 2019-07-15 MED ORDER — LACTATED RINGERS IV SOLN: 500.0000 mL | Freq: Once | INTRAVENOUS | Status: DC

## 2019-07-15 NOTE — H&P (Signed)
OBSTETRIC ADMISSION HISTORY AND PHYSICAL  Wendy Atkins is a 28 y.o. female [phone] with IUP at [redacted]w[redacted]d by 6wk [place] presenting for IOL for postdates. She reports +FMs, No LOF, no VB, no blurry vision, headaches or peripheral edema, and RUQ pain.  She plans on breast and bottle feeding. She requests interval BTL for birth control. She received her prenatal care at [facility]: By 6wk [place] --->  Estimated Date of Delivery: 07/08/19  Sono:  6/17  @[redacted]w[redacted]d , CWD, normal anatomy, breech presentation, posterior placental lie, 540g, 48% EFW  Prenatal History/Complications: None  Past Medical History: Past Medical History:  Diagnosis Date  . Depression    doing good now  . Gonorrhea   . Medical history non-contributory   . Pregnant   . SVD (spontaneous vaginal delivery)    x 3    Past Surgical History: Past Surgical History:  Procedure Laterality Date  . DILATION AND EVACUATION N/A 08/04/2018   Procedure: DILATATION AND EVACUATION - Ultrasound Guided;  Surgeon: Sloan Leiter, MD;  Location: [facility];  Service: Gynecology;  Laterality: N/A;    Obstetrical History: OB History    Gravida  6   Para  3   Term  3   Preterm      AB  2   Living  3     SAB  1   TAB      Ectopic      Multiple      Live Births  3           Social History: Social History   Socioeconomic History  . Marital status: Single    Spouse name: Not on file  . Number of children: 3  . Years of education: Not on file  . Highest education level: Not on file  Occupational History  . Not on file  Social Needs  . Financial resource strain: Not on file  . Food insecurity    Worry: Not on file    Inability: Not on file  . Transportation needs    Medical: Not on file    Non-medical: Not on file  Tobacco Use  . Smoking status: Former Smoker    Packs/day: 0.50    Years: 5.00    Pack years: 2.50    Types: Cigarettes    Quit date: 06/11/2018    Years since quitting: 1.0  . Smokeless tobacco: Never  Used  Substance and Sexual Activity  . Alcohol use: Not Currently    Alcohol/week: 2.0 standard drinks    Types: 2 Shots of liquor per week  . Drug use: No  . Sexual activity: Not Currently  Lifestyle  . Physical activity    Days per week: Not on file    Minutes per session: Not on file  . Stress: Not on file  Relationships  . Social Herbalist on phone: Not on file    Gets together: Not on file    Attends religious service: Not on file    Active member of club or organization: Not on file    Attends meetings of clubs or organizations: Not on file    Relationship status: Not on file  Other Topics Concern  . Not on file  Social History Narrative  . Not on file    Family History: Family History  Problem Relation Age of Onset  . Diabetes Mother   . Diabetes Father   . Hypertension Father     Allergies: Allergies  Allergen Reactions  . Ampicillin Itching    Itching arm while antibiotic infusing Has patient had a PCN reaction causing immediate rash, facial/tongue/throat swelling, SOB or lightheadedness with hypotension: Yes Has patient had a PCN reaction causing severe rash involving mucus membranes or skin necrosis: No Has patient had a PCN reaction that required hospitalization: No Has patient had a PCN reaction occurring within the last 10 years: Yes If all of the above answers are "NO", then may proceed with Cephalosporin use.     Medications Prior to Admission  Medication Sig Dispense Refill Last Dose  . Prenatal Vit-Fe Fumarate-FA (PRENATAL MULTIVITAMIN) TABS tablet Take 1 tablet by mouth daily at 12 noon.   Past Week at Unknown time  . cephALEXin (KEFLEX) 500 MG capsule Take 1 capsule (500 mg total) by mouth 4 (four) times daily. 28 capsule 0   . cyclobenzaprine (FLEXERIL) 10 MG tablet Take 1 tablet (10 mg total) by mouth 2 (two) times daily as needed for muscle spasms. 20 tablet 0   . Elastic Bandages & Supports (COMFORT FIT MATERNITY SUPP LG) MISC 1  Units by Does not apply route daily as needed. 1 each 0      Review of Systems   All systems reviewed and negative except as stated in HPI  Blood pressure 134/74, pulse 94, temperature 97.7 F (36.5 C), temperature source Oral, resp. rate 18, height 5\' 7"  (1.702 m), weight 124.6 kg, last menstrual period 10/01/2018, unknown if currently breastfeeding. General appearance: alert, cooperative and appears stated age Lungs: normal effort Heart: regular rate  Abdomen: soft, non-tender; bowel sounds normal Pelvic: gravid uterus Extremities: Homans sign is negative, no sign of DVT Presentation: cephalic Fetal monitoringBaseline: 160 bpm, Variability: Good {> 6 bpm), Accelerations: Reactive and Decelerations: Absent Uterine activityNone Dilation: 3.5 Effacement (%): 50 Station: -3 Exam by:: Herma CarsonJ. Greene, RN   Prenatal labs: ABO, Rh: --/--/PENDING (10/18 0102)B pos Antibody: PENDING (10/18 0102)Neg Rubella:  Immune RPR:   neg HBsAg:   neg HIV:   neg GBS:   neg 1 hr Glucola 94 Genetic screening  Not done Anatomy US WNL  Prenatal Transfer Tool  Maternal Diabetes: No Genetic Screening: Declined Maternal Ultrasounds/Referrals: Normal Fetal Ultrasounds or other Referrals:  None Maternal Substance Abuse:  No Significant Maternal Medications:  None Significant Maternal Lab Results: Group B Strep negative  Results for orders placed or performed during the hospital encounter of 07/15/19 (from the past 24 hour(s))  CBC   Collection Time: 07/15/19  1:02 AM  Result Value Ref Range   WBC 11.0 (H) 4.0 - 10.5 K/uL   RBC 3.83 (L) 3.87 - 5.11 MIL/uL   Hemoglobin 10.5 (L) 12.0 - 15.0 g/dL   HCT 28.434.0 (L) 13.236.0 - 44.046.0 %   MCV 88.8 80.0 - 100.0 fL   MCH 27.4 26.0 - 34.0 pg   MCHC 30.9 30.0 - 36.0 g/dL   RDW 10.216.1 (H) 72.511.5 - 36.615.5 %   Platelets 250 150 - 400 K/uL   nRBC 0.0 0.0 - 0.2 %  Type and screen [facility]   Collection Time: 07/15/19  1:02 AM  Result Value Ref Range    ABO/RH(D) PENDING    Antibody Screen PENDING    Sample Expiration      07/18/2019,2359 Performed at [facility], [address] [place], [place] [address]     Patient Active Problem List   Diagnosis Date Noted  . Encounter for induction of labor 07/15/2019  . H/O molar  pregnancy, antepartum 12/04/2018  . 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 Bacterial vaginitis 06/27/2018  . Pregnancy of unknown anatomic location 06/27/2018    Assessment/Plan:  Wendy Atkins is a 28 y.o. [id number] at [redacted]w[redacted]d here for for postdates IOL.  #Labor: Will induce with Cytotec. Anticipate SVD. #Pain: IV pain meds; epidural as desires #FWB: Cat I; EFW: 3700g #ID:  GBS neg #MOF: Both #MOC: Interval   Jerilynn Birkenhead, MD OB Family Medicine Fellow, [facility] for [facility], [facility] 07/15/2019, 1:57 AM

## 2019-07-15 NOTE — Progress Notes (Addendum)
LABOR PROGRESS NOTE  Wendy Atkins is a 28 y.o. female [phone] with IUP at [redacted]w[redacted]d by 1TMUS presenting for IOL for postdates.  Subjective: Epidural placed. Pain well controlled. Feeling ctx.  Objective: BP (!) 103/54   Pulse 95   Temp 98.8 F (37.1 C) (Oral)   Resp 16   Ht 5\' 7"  (1.702 m)   Wt 124.6 kg   LMP 10/01/2018   SpO2 98%   BMI 43.01 kg/m  or  Vitals:   07/15/19 1601 07/15/19 1606 07/15/19 1616 07/15/19 1630  BP: 114/73 115/78 (!) 108/57 (!) 103/54  Pulse: (!) 110 (!) 106 98 95  Resp: 14 16 14 16   Temp:      TempSrc:      SpO2:      Weight:      Height:       Dilation: 5 Effacement: 70 Station: -2 Presentation: Vertex  FHT: baseline rate 160, moderate varibility, acel, variable decelerations x 2  Toco: q 3 min, MVU 150   Labs: Lab Results  Component Value Date   WBC 11.0 (H) 07/15/2019   HGB 10.5 (L) 07/15/2019   HCT 34.0 (L) 07/15/2019   MCV 88.8 07/15/2019   PLT 250 07/15/2019    Patient Active Problem List   Diagnosis Date Noted  . Encounter for induction of labor 07/15/2019  . H/O molar pregnancy, antepartum 12/04/2018  . 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 Bacterial vaginitis 06/27/2018  . Pregnancy of unknown anatomic location 06/27/2018    Assessment/Plan:  Wendy Atkins is a 28 y.o. [id number] at [redacted]w[redacted]d here for for postdates IOL.  #Labor: Labor is progressing. IUPC placed w/ inadequate ctxs. Increasing pit.   -SVE: 3.5 @ 0103  > 4 @0951  > 4 & AROM @ 1359. > 5 @ 1300 -Augmentation: S/P Cytotec x2 . Pitocin @ [id number]. IUPC @ 6378  #Pain:  Epidural placed #FWB: Cat II.  #ID:GBS neg #Contraception: Tubal  #MOF: Both  [name], MD, PGY-1  Labor and Delivery Teaching service  07/15/2019, 4:56 PM

## 2019-07-15 NOTE — Discharge Summary (Addendum)
Postpartum Discharge Summary     Patient Name: Wendy Atkins DOB: [birth year] MRN: [id number]  Date of admission: 07/15/2019 Delivering Provider: Ulla Gallo B   Date of discharge: 07/17/2019  Admitting diagnosis: PREG Intrauterine pregnancy: [redacted]w[redacted]d    Secondary diagnosis:  Active Problems:   Encounter for induction of labor   [redacted] weeks gestation of pregnancy  Additional problems: None     Discharge diagnosis: Term Pregnancy Delivered                                                                                                Post partum procedures:None  Augmentation: AROM, Pitocin and Cytotec  Complications: None  Hospital course:  Induction of Labor With Vaginal Delivery   28y.o. yo [id number] admitted to the hospital 07/15/2019 for induction of labor.  Indication for induction: Postdates.  Patient had an uncomplicated labor course as follows: Patient received Cytotec, Pitocin and AROM. Received epidural. She progressed to complete with uncomplicated delivery.  Membrane Rupture Time/Date: 1:59 PM ,07/15/2019   Intrapartum Procedures: Episiotomy: None [1]                                         Lacerations:  None [1]  Patient had delivery of a Viable infant.  Information for the patient's newborn:  [place], [place]0[[id number]]Delivery Method: Vaginal, Spontaneous(Filed from Delivery Summary)    07/15/2019  Details of delivery can be found in separate delivery note.  Patient had a routine postpartum course. BTL (T19) paperwork signed and appt requested with [facility] for pre-op assessment. Paperwork to be scanned into chart. Patient is discharged home 07/17/19. Delivery time: 6:51 PM    Magnesium Sulfate received: No BMZ received: No Rhophylac:No MMR:No Transfusion:No  Physical exam  Vitals:   07/16/19 0700 07/16/19 1433 07/16/19 2130 07/17/19 0533  BP: 116/75 119/70 124/79 124/60  Pulse: 91 97 98 90  Resp: 16 18 18    Temp: 98 F (36.7 C)  98.3 F (36.8  C)   TempSrc: Oral  Oral   SpO2: 98% 98%    Weight:      Height:       General: alert, cooperative and no distress Lochia: appropriate Uterine Fundus: firm Incision: N/A DVT Evaluation: No evidence of DVT seen on physical exam. No significant calf/ankle edema. Labs: Lab Results  Component Value Date   WBC 12.0 (H) 07/16/2019   HGB 9.4 (L) 07/16/2019   HCT 31.3 (L) 07/16/2019   MCV 92.6 07/16/2019   PLT 197 07/16/2019   CMP Latest Ref Rng & Units 08/04/2018  Glucose 70 - 99 mg/dL 87  BUN 6 - 20 mg/dL 9  Creatinine 0.44 - 1.00 mg/dL 0.62  Sodium 135 - 145 mmol/L 134(L)  Potassium 3.5 - 5.1 mmol/L 3.8  Chloride 98 - 111 mmol/L 105  CO2 22 - 32 mmol/L 22  Calcium 8.9 - 10.3 mg/dL 9.0  Total Protein 6.5 - 8.1 g/dL 7.9  Total Bilirubin 0.3 - 1.2 mg/dL 0.8  Alkaline Phos 38 - 126 U/L 76  AST 15 - 41 U/L 14(L)  ALT 0 - 44 U/L 10    Discharge instruction: per After Visit Summary and "Baby and Me Booklet".  After visit meds:  Allergies as of 07/17/2019      Reactions   Ampicillin Itching   Itching arm while antibiotic infusing Has patient had a PCN reaction causing immediate rash, facial/tongue/throat swelling, SOB or lightheadedness with hypotension: Yes Has patient had a PCN reaction causing severe rash involving mucus membranes or skin necrosis: No Has patient had a PCN reaction that required hospitalization: No Has patient had a PCN reaction occurring within the last 10 years: Yes If all of the above answers are "NO", then may proceed with Cephalosporin use.      Medication List    STOP taking these medications   cephALEXin 500 MG capsule Commonly known as: Keflex   Comfort Fit Maternity Supp Lg Misc   cyclobenzaprine 10 MG tablet Commonly known as: FLEXERIL     TAKE these medications   acetaminophen 325 MG tablet Commonly known as: Tylenol Take 2 tablets (650 mg total) by mouth every 4 (four) hours as needed (for pain scale < 4).   ferrous sulfate 325  (65 FE) MG EC tablet Take 1 tablet (325 mg total) by mouth every other day.   ibuprofen 600 MG tablet Commonly known as: ADVIL Take 1 tablet (600 mg total) by mouth every 6 (six) hours.   prenatal multivitamin Tabs tablet Take 1 tablet by mouth daily at 12 noon.       Diet: routine diet  Activity: Advance as tolerated. Pelvic rest for 6 weeks.   Outpatient follow up:4 weeks Follow up Appt: Future Appointments  Date Time Provider [place]  08/16/2019  3:35 PM Donnamae Jude, MD [facility]   Follow up Visit:  Patient to schedule f/u with HD. Appt with [facility] requested for BTL pre-op. Paperwork signed 10/18.   Newborn Data: Live born female  Birth Weight: 3561 grams  APGAR (1 MIN): 7 APGAR (5 MINS): 9  Newborn Delivery   Birth date/time: 07/15/2019 18:51:00 Delivery type: Vaginal, Spontaneous      Baby Feeding: Bottle and Breast Disposition:home with mother   07/17/2019 Clarnce Flock, MD

## 2019-07-15 NOTE — Anesthesia Preprocedure Evaluation (Signed)
Anesthesia Evaluation  Patient identified by MRN, date of birth, ID band Patient awake    Airway Mallampati: III  TM Distance: >3 FB Neck ROM: Full    Dental  (+) Teeth Intact Orthodontic appliances:   Pulmonary former smoker,    Pulmonary exam normal breath sounds clear to auscultation       Cardiovascular Normal cardiovascular exam Rhythm:Regular Rate:Normal     Neuro/Psych negative neurological ROS  negative psych ROS   GI/Hepatic Neg liver ROS, GERD  Medicated,  Endo/Other  Morbid obesity  Renal/GU negative Renal ROS     Musculoskeletal negative musculoskeletal ROS (+)   Abdominal (+) + obese,   Peds  Hematology   Anesthesia Other Findings   Reproductive/Obstetrics (+) Pregnancy Molar pregnancy 7 weeks                             Anesthesia Physical  Anesthesia Plan  ASA: III  Anesthesia Plan: Epidural   Post-op Pain Management:    Induction:   PONV Risk Score and Plan:   Airway Management Planned:   Additional Equipment:   Intra-op Plan:   Post-operative Plan:   Informed Consent: I have reviewed the patients History and Physical, chart, labs and discussed the procedure including the risks, benefits and alternatives for the proposed anesthesia with the patient or authorized representative who has indicated his/her understanding and acceptance.       Plan Discussed with:   Anesthesia Plan Comments:         Anesthesia Quick Evaluation

## 2019-07-15 NOTE — Anesthesia Procedure Notes (Signed)
Epidural Patient location during procedure: OB  Staffing Anesthesiologist: Nolon Nations, MD Performed: anesthesiologist   Preanesthetic Checklist Completed: patient identified, pre-op evaluation, timeout performed, IV checked, risks and benefits discussed and monitors and equipment checked  Epidural Patient position: sitting Prep: site prepped and draped and DuraPrep Patient monitoring: heart rate, continuous pulse ox and blood pressure Approach: midline Location: L3-L4 Injection technique: LOR air and LOR saline  Needle:  Needle type: Tuohy  Needle gauge: 17 G Needle length: 9 cm Needle insertion depth: 7 cm Catheter type: closed end flexible Catheter size: 19 Gauge Catheter at skin depth: 12 cm Test dose: negative  Assessment Sensory level: T8 Events: blood aspirated, injection not painful, no injection resistance, negative IV test and no paresthesia  Additional Notes Catheter removed and replaced. - RBCs with 2nd catheter.Reason for block:procedure for pain

## 2019-07-15 NOTE — Progress Notes (Addendum)
LABOR PROGRESS NOTE  Neomia Herbel is a 28 y.o. female [phone] with IUP at [redacted]w[redacted]d by 1TMUS presenting for IOL for postdates.  Subjective:  She reports +FMs, No LOF, no VB, no blurry vision, headaches or peripheral edema, and RUQ pain.  She plans on breast and bottle feeding.   Objective: BP (!) 106/52   Pulse 94   Temp 98.9 F (37.2 C) (Oral)   Resp 14   Ht 5\' 7"  (1.702 m)   Wt 124.6 kg   LMP 10/01/2018   BMI 43.01 kg/m  or  Vitals:   07/15/19 0601 07/15/19 0701 07/15/19 0800 07/15/19 0901  BP: (!) 104/59 (!) 96/48 121/74 (!) 106/52  Pulse: 93 95 93 94  Resp: 16 18 16 14   Temp:  98.9 F (37.2 C)    TempSrc:  Oral    Weight:      Height:         Dilation: 4 Effacement (%): 60, 70 Station: Ballotable Presentation: Vertex Exam by:: Ignacia Felling, RN; Dr. Jones Bales FHT: baseline rate 155, moderate varibility, acel, no decel Toco: none  Labs: Lab Results  Component Value Date   WBC 11.0 (H) 07/15/2019   HGB 10.5 (L) 07/15/2019   HCT 34.0 (L) 07/15/2019   MCV 88.8 07/15/2019   PLT 250 07/15/2019    Patient Active Problem List   Diagnosis Date Noted  . Encounter for induction of labor 07/15/2019  . H/O molar pregnancy, antepartum 12/04/2018  . 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 Bacterial vaginitis 06/27/2018  . Pregnancy of unknown anatomic location 06/27/2018    Assessment/Plan:  Annaliese Saez is a 28 y.o. [id number] at [redacted]w[redacted]d here for for postdates IOL.  #Labor: 3.5 @ 0103  > 5 @ 944. Labor progressing. Pitocin started @ 955.  #Pain:  IV pain meds; epidural as desires #FWB: Cat I; EFW: 3700g #ID:      GBS neg #Contraception: Tubal  #MOF: Both  [name], PGY-1, MD+ Labor and Delivery Teaching service  07/15/2019, 9:52 AM   I saw and evaluated the patient. I agree with the findings and the plan of care as documented in the resident's note. S/p Cytotec. Now 60-70%, will start low dose Pit.   Barrington Ellison, MD [facility] Family Medicine Fellow, [facility]  for [employer], [place]

## 2019-07-15 NOTE — Progress Notes (Addendum)
LABOR PROGRESS NOTE  Wendy Atkins is a 28 y.o. female [phone] with IUP at [redacted]w[redacted]d by 1TMUS presenting for IOL for postdates.  Subjective:  She reports +FMs, No LOF, no VB, no blurry vision, headaches or peripheral edema, and RUQ pain.`  Objective: BP 120/72   Pulse 89   Temp 97.8 F (36.6 C) (Oral)   Resp 14   Ht 5\' 7"  (1.702 m)   Wt 124.6 kg   LMP 10/01/2018   BMI 43.01 kg/m  or  Vitals:   07/15/19 1159 07/15/19 1224 07/15/19 1300 07/15/19 1330  BP: 117/71 127/90 116/80 120/72  Pulse: 88 96 92 89  Resp: 16 14 16 14   Temp: 97.8 F (36.6 C)     TempSrc: Oral     Weight:      Height:         Dilation: 4 Effacement (%): 50 Station: -3 Presentation: Vertex Exam by:: Dr. Marice Potter FHT: baseline rate 155, moderate varibility, acel, no decel Toco: none  Labs: Lab Results  Component Value Date   WBC 11.0 (H) 07/15/2019   HGB 10.5 (L) 07/15/2019   HCT 34.0 (L) 07/15/2019   MCV 88.8 07/15/2019   PLT 250 07/15/2019    Patient Active Problem List   Diagnosis Date Noted  . Encounter for induction of labor 07/15/2019  . H/O molar pregnancy, antepartum 12/04/2018  . 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 Bacterial vaginitis 06/27/2018  . Pregnancy of unknown anatomic location 06/27/2018    Assessment/Plan:  Wendy Atkins is a 28 y.o. [id number] at [redacted]w[redacted]d here for for postdates IOL.  #Labor: Minimal change w/ infrequent ctx. Continue augmentation with Pit. If no significant change will give additional cytotec and place IUPC.  -SVE: 3.5 @ 0103  > 4 @0951  > 4 & AROM @ 1359. -Augmentation: S/P Cytotec x2 . Pitocin @ [phone] #Pain:  IV pain meds; epidural as desires #FWB: Cat I; EFW: 3700g #ID:GBS neg #Contraception: Tubal  #MOF: Both  [name], MD, PGY-1  Labor and Delivery Teaching service  07/15/2019, 2:08 PM   I saw and evaluated the patient. I agree with the findings and the plan of care as documented in the resident's note. Not as much effacement as I was hoping; may need  to stop Pit and give another Cytotec but will continue with Pit and see if AROM will now have an effect. Only small amount of clear fluid with AROM.  Barrington Ellison, MD [facility] Family Medicine Fellow, [facility] [address] for [employer], [place]

## 2019-07-16 ENCOUNTER — Encounter (HOSPITAL_COMMUNITY): Payer: Self-pay

## 2019-07-16 LAB — CBC
HCT: 31.3 % — ABNORMAL LOW (ref 36.0–46.0)
Hemoglobin: 9.4 g/dL — ABNORMAL LOW (ref 12.0–15.0)
MCH: 27.8 pg (ref 26.0–34.0)
MCHC: 30 g/dL (ref 30.0–36.0)
MCV: 92.6 fL (ref 80.0–100.0)
Platelets: 197 10*3/uL (ref 150–400)
RBC: 3.38 MIL/uL — ABNORMAL LOW (ref 3.87–5.11)
RDW: 16.1 % — ABNORMAL HIGH (ref 11.5–15.5)
WBC: 12 10*3/uL — ABNORMAL HIGH (ref 4.0–10.5)
nRBC: 0 % (ref 0.0–0.2)

## 2019-07-16 MED ORDER — OXYCODONE HCL 5 MG PO TABS
5.0000 mg | ORAL_TABLET | ORAL | Status: DC | PRN
  Administered 2019-07-16: 21:00:00 5 mg via ORAL
  Filled 2019-07-16: qty 1

## 2019-07-16 NOTE — Lactation Note (Signed)
This note was copied from a baby's chart. Lactation Consultation Note  Patient Name: Wendy Atkins YJEHU'D Date: 07/16/2019\  P4, Baby 23 hours old and latched upon entering with intermittent swallows in football hold on L breast. Nichols in room during consult. Mother states she bf others for approx 1 month.   She states she plans to breastfeed and formula feed but this baby only likes the breast. Encouraged bf before offering formula. Feed on demand with cues.  Goal 8-12+ times per day after first 24 hrs.  Place baby STS if not cueing.  Provided mother with lactation brochure.        Maternal Data    Feeding Feeding Type: Breast Fed  LATCH Score                   Interventions    Lactation Tools Discussed/Used     Consult Status      Carlye Grippe 07/16/2019, 9:43 AM

## 2019-07-16 NOTE — Clinical Social Work Maternal (Signed)
CLINICAL SOCIAL WORK MATERNAL/CHILD NOTE  Patient Details  Name: Wendy Atkins MRN: [id number] Date of Birth: 07/18/1991  Date:  07/16/2019  Clinical Social Worker Initiating Note:  Jaymari Cromie, LCSW Date/Time: Initiated:  07/16/19/0930     Child's Name:  Wendy Atkins   Biological Parents:  Mother   Need for Interpreter:  None   Reason for Referral:  Current Substance Use/Substance Use During Pregnancy    Address:  [address] [place] [address]    Phone number:  [phone] (home)     Additional phone number:none   Household Members/Support Persons (HM/SP):   Household Member/Support Person 2, Household Member/Support Person 1   HM/SP Name Relationship DOB or Age  HM/SP -1 Aniyha Villalon MOB  27  HM/SP -2 Truziyah Allred  daughter   2  HM/SP -3  Onesty Godbolt  daughter   9  HM/SP -4  Ahniyla Roberts  daughter   8  HM/SP -5        HM/SP -6        HM/SP -7        HM/SP -8          Natural Supports (not living in the home):  Extended Family   Professional Supports: None   Employment: Unemployed   Type of Work: none   Education:  High school graduate   Homebound arranged:  n/a  Financial Resources:  Medicaid   Other Resources:  WIC, Food Stamps (plans to apply for food stamps.)   Cultural/Religious Considerations Which May Impact Care:  none reported.   Strengths:  Pediatrician chosen, Compliance with medical plan , Ability to meet basic needs , Home prepared for child    Psychotropic Medications:   none       Pediatrician:     [facility]   Pediatrician List:   [place] ([facility])  [place]    [place]    [place]     [place]    [place]      Pediatrician Fax Number:    Risk Factors/Current Problems:  None   Cognitive State:  Alert , Able to Concentrate , Insightful    Mood/Affect:  Calm , Comfortable , Relaxed    CSW Assessment: CSW consulted as MOB had THC use earlier in  pregnancy. CSW went to speak with MOB at bedside to offer further support. Upon entering the room CSW congratulated MOB on the birth of infant. MOB advised MOB of CSW's role and the reason for CSW coming to visit with her. MOB reported that she did have THC use earlier in her pregnancy, MOB reported that from her recollection, she last used THC in April 2020. CSW understanding and advised of hospital drug screen policy at this time. MOB reported that she understood. CSW advised MOB that there are two different forms of drug screens that are done. UDS and CDS. CSW advised MOB that if either is positive for any substance that she wasn't given by an MD or given while here. MOB reported no other substance use during this pregnancy.   CSW assessed MOB for safety in which MOB reports that she is not feelings SI or HI. MOB also reported that she is not involved in any domestic violence. CSW inquired from MOB on her mental health history. MOB reported that she self diagnosed herself with anxiety and depression based upon the feelings that she was feeling. MOB reported that since she self diagnosed herself she was never placed on   any medications or placed in therapy. MOB reported that her depression and anxiety began for her at the start of her pregnancy.   MOB expressed that her anxiety was based around COVID-19 and homeschooling her daughters. 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 MOB reports that she was dealing with a bad relationship at that time which also was a cause of her PPD. CSW offered MOB therapy resources in the event that feelings of anxiety reoccur for her-MOB declined at this moment.  MOB reports that currently is feeling fine with no concerns at this time. MOB reports that she has support from her family at this time. MOB plans to apply for Food Stamps but reports that she already gets WIC. MOB reports that she all needed items to care for infant with plans for infant to  receive follow up care at [facility].  CSW will continue to monitor infants UDS and CDS for CPS report if warranted.   CSW Plan/Description:  No Further Intervention Required/No Barriers to Discharge, Sudden Infant Death Syndrome (SIDS) Education, Perinatal Mood and Anxiety Disorder (PMADs) Education, Hospital Drug Screen Policy Information, CSW Will Continue to Monitor Umbilical Cord Tissue Drug Screen Results and Make Report if Warranted    Chaylee Ehrsam S Keali Mccraw, LCSWA 07/16/2019, 10:17 AM 

## 2019-07-16 NOTE — Progress Notes (Signed)
POSTPARTUM PROGRESS NOTE  Post Partum Day 1  Subjective:  Wendy Atkins is a 28 y.o. [id number] s/p NSVD at [redacted]w[redacted]d admitted for IOL for post date.  She reports she is doing well. No acute events overnight. She denies any problems with ambulating, voiding or po intake. Denies nausea or vomiting.  Pain is well controlled.  Lochia is minimal.  Objective: Blood pressure 116/75, pulse 91, temperature 98 F (36.7 C), temperature source Oral, resp. rate 16, height 5\' 7"  (1.702 m), weight 124.6 kg, last menstrual period 10/01/2018, SpO2 98 %, unknown if currently breastfeeding.  Physical Exam:  General: alert, cooperative and no distress Chest: no respiratory distress Heart:regular rate, distal pulses intact Abdomen: soft, nontender,  Uterine Fundus: firm, appropriately tender DVT Evaluation: No calf swelling or tenderness Extremities: no LE edema Skin: warm, dry  Recent Labs    07/15/19 0102 07/16/19 0442  HGB 10.5* 9.4*  HCT 34.0* 31.3*    Assessment/Plan:  Wendy Atkins is a 28 y.o. [id number] s/p NSVD at [redacted]w[redacted]d admitted for IOL for post date.   PPD#1 - Doing well. Routine postpartum care. Plan for BTL today  Contraception: BTL today s/p epidural this a.m  Feeding: breast and bottle  Dispo: Plan for d/c tomorrow.    LOS: 1 day   [name], PGY-1, MD Labor and Delivery Teaching service  07/16/2019, 7:50 AM

## 2019-07-16 NOTE — Anesthesia Postprocedure Evaluation (Signed)
Anesthesia Post Note  Patient: Wendy Atkins  Procedure(s) Performed: AN AD HOC LABOR EPIDURAL     Patient location during evaluation: Mother Baby Anesthesia Type: Epidural Level of consciousness: awake Pain management: satisfactory to patient Vital Signs Assessment: post-procedure vital signs reviewed and stable Respiratory status: spontaneous breathing Cardiovascular status: stable Anesthetic complications: no    Last Vitals:  Vitals:   07/16/19 0207 07/16/19 0700  BP: 119/79 116/75  Pulse: 92 91  Resp: 16 16  Temp: 36.7 C 36.7 C  SpO2: 98% 98%    Last Pain:  Vitals:   07/16/19 0700  TempSrc: Oral  PainSc: 7    Pain Goal: Patients Stated Pain Goal: 2 (07/15/19 2150)                 Casimer Lanius

## 2019-07-17 MED ORDER — IBUPROFEN 600 MG PO TABS: 600.0000 mg | ORAL_TABLET | Freq: Four times a day (QID) | ORAL | 0 refills | Status: DC

## 2019-07-17 MED ORDER — ACETAMINOPHEN 325 MG PO TABS: 650.0000 mg | ORAL_TABLET | ORAL | 0 refills | Status: DC | PRN

## 2019-07-17 MED ORDER — FERROUS SULFATE 325 (65 FE) MG PO TBEC: 325.0000 mg | DELAYED_RELEASE_TABLET | ORAL | 0 refills | Status: DC

## 2019-07-17 NOTE — Discharge Instructions (Signed)
Postpartum Care After Vaginal Delivery °This sheet gives you information about how to care for yourself from the time you deliver your baby to up to 6-12 weeks after delivery (postpartum period). Your health care provider may also give you more specific instructions. If you have problems or questions, contact your health care provider. °Follow these instructions at home: °Vaginal bleeding °· It is normal to have vaginal bleeding (lochia) after delivery. Wear a sanitary pad for vaginal bleeding and discharge. °? During the first week after delivery, the amount and appearance of lochia is often similar to a menstrual period. °? Over the next few weeks, it will gradually decrease to a dry, yellow-brown discharge. °? For most women, lochia stops completely by 4-6 weeks after delivery. Vaginal bleeding can vary from woman to woman. °· Change your sanitary pads frequently. Watch for any changes in your flow, such as: °? A sudden increase in volume. °? A change in color. °? Large blood clots. °· If you pass a blood clot from your vagina, save it and call your health care provider to discuss. Do not flush blood clots down the toilet before talking with your health care provider. °· Do not use tampons or douches until your health care provider says this is safe. °· If you are not breastfeeding, your period should return 6-8 weeks after delivery. If you are feeding your child breast milk only (exclusive breastfeeding), your period may not return until you stop breastfeeding. °Perineal care °· Keep the area between the vagina and the anus (perineum) clean and dry as told by your health care provider. Use medicated pads and pain-relieving sprays and creams as directed. °· If you had a cut in the perineum (episiotomy) or a tear in the vagina, check the area for signs of infection until you are healed. Check for: °? More redness, swelling, or pain. °? Fluid or blood coming from the cut or tear. °? Warmth. °? Pus or a bad  smell. °· You may be given a squirt bottle to use instead of wiping to clean the perineum area after you go to the bathroom. As you start healing, you may use the squirt bottle before wiping yourself. Make sure to wipe gently. °· To relieve pain caused by an episiotomy, a tear in the vagina, or swollen veins in the anus (hemorrhoids), try taking a warm sitz bath 2-3 times a day. A sitz bath is a warm water bath that is taken while you are sitting down. The water should only come up to your hips and should cover your buttocks. °Breast care °· Within the first few days after delivery, your breasts may feel heavy, full, and uncomfortable (breast engorgement). Milk may also leak from your breasts. Your health care provider can suggest ways to help relieve the discomfort. Breast engorgement should go away within a few days. °· If you are breastfeeding: °? Wear a bra that supports your breasts and fits you well. °? Keep your nipples clean and dry. Apply creams and ointments as told by your health care provider. °? You may need to use breast pads to absorb milk that leaks from your breasts. °? You may have uterine contractions every time you breastfeed for up to several weeks after delivery. Uterine contractions help your uterus return to its normal size. °? If you have any problems with breastfeeding, work with your health care provider or lactation consultant. °· If you are not breastfeeding: °? Avoid touching your breasts a lot. Doing this can make   your breasts produce more milk. °? Wear a good-fitting bra and use cold packs to help with swelling. °? Do not squeeze out (express) milk. This causes you to make more milk. °Intimacy and sexuality °· Ask your health care provider when you can engage in sexual activity. This may depend on: °? Your risk of infection. °? How fast you are healing. °? Your comfort and desire to engage in sexual activity. °· You are able to get pregnant after delivery, even if you have not had  your period. If desired, talk with your health care provider about methods of birth control (contraception). °Medicines °· Take over-the-counter and prescription medicines only as told by your health care provider. °· If you were prescribed an antibiotic medicine, take it as told by your health care provider. Do not stop taking the antibiotic even if you start to feel better. °Activity °· Gradually return to your normal activities as told by your health care provider. Ask your health care provider what activities are safe for you. °· Rest as much as possible. Try to rest or take a nap while your baby is sleeping. °Eating and drinking ° °· Drink enough fluid to keep your urine pale yellow. °· Eat high-fiber foods every day. These may help prevent or relieve constipation. High-fiber foods include: °? Whole grain cereals and breads. °? Brown rice. °? Beans. °? Fresh fruits and vegetables. °· Do not try to lose weight quickly by cutting back on calories. °· Take your prenatal vitamins until your postpartum checkup or until your health care provider tells you it is okay to stop. °Lifestyle °· Do not use any products that contain nicotine or tobacco, such as cigarettes and e-cigarettes. If you need help quitting, ask your health care provider. °· Do not drink alcohol, especially if you are breastfeeding. °General instructions °· Keep all follow-up visits for you and your baby as told by your health care provider. Most women visit their health care provider for a postpartum checkup within the first 3-6 weeks after delivery. °Contact a health care provider if: °· You feel unable to cope with the changes that your child brings to your life, and these feelings do not go away. °· You feel unusually sad or worried. °· Your breasts become red, painful, or hard. °· You have a fever. °· You have trouble holding urine or keeping urine from leaking. °· You have little or no interest in activities you used to enjoy. °· You have not  breastfed at all and you have not had a menstrual period for 12 weeks after delivery. °· You have stopped breastfeeding and you have not had a menstrual period for 12 weeks after you stopped breastfeeding. °· You have questions about caring for yourself or your baby. °· You pass a blood clot from your vagina. °Get help right away if: °· You have chest pain. °· You have difficulty breathing. °· You have sudden, severe leg pain. °· You have severe pain or cramping in your lower abdomen. °· You bleed from your vagina so much that you fill more than one sanitary pad in one hour. Bleeding should not be heavier than your heaviest period. °· You develop a severe headache. °· You faint. °· You have blurred vision or spots in your vision. °· You have bad-smelling vaginal discharge. °· You have thoughts about hurting yourself or your baby. °If you ever feel like you may hurt yourself or others, or have thoughts about taking your own life, get help   right away. You can go to the nearest emergency department or call:  Your local emergency services (911 in the [place]  A suicide crisis helpline, such as the [facility] at 559-513-1176. This is open 24 hours a day. Summary  The period of time right after you deliver your newborn up to 6-12 weeks after delivery is called the postpartum period.  Gradually return to your normal activities as told by your health care provider.  Keep all follow-up visits for you and your baby as told by your health care provider. This information is not intended to replace advice given to you by your health care provider. Make sure you discuss any questions you have with your health care provider. Document Released: 07/11/2007 Document Revised: 09/16/2017 Document Reviewed: 06/27/2017 Elsevier Patient Education  2020 [place].   Surgery to Prevent Pregnancy Female sterilization is surgery to prevent pregnancy. In this surgery, the fallopian tubes are  either blocked or closed off. When the fallopian tubes are closed, the eggs that the ovaries release cannot enter the uterus, sperm cannot reach the eggs, and you cannot get pregnant. Sterilization is permanent. It should only be done if you are sure that you do not want to be able to have children. What are the sterilization surgery options? There are several kinds of female sterilization surgeries. They include:  Laparoscopic tubal ligation. In this surgery, the fallopian tubes are tied off, sealed with heat, or blocked with a clip, ring, or clamp. A small portion of each fallopian tube may also be removed. This surgery is done through several small cuts (incisions) with special instruments that are inserted into your abdomen.  Postpartum tubal ligation. This is also called a mini-laparotomy. This surgery is done right after childbirth or 1 or 2 days after childbirth. In this surgery, the fallopian tubes are tied off, sealed with heat, or blocked with a clip, ring, or clamp. A small portion of each fallopian tube may also be removed. The surgery is done through a single incision in the abdomen.  Tubal ligation during a C-section. In this surgery, the fallopian tubes are tied off, sealed with heat, or blocked with a clip, ring, or clamp. A small portion of each fallopian tube may also be removed. The surgery is done at the same time as a C-section delivery. Is sterilization safe? Generally, sterilization is safe. Complications are rare. However, there are risks. They include:  Bleeding.  Infection.  Reaction to medicine used during the procedure.  Injury to surrounding organs.  Failure of the procedure. How effective is sterilization? Sterilization is nearly 100% effective, but it can fail. In rare cases, the fallopian tubes can grow back together over time. If this happens, pregnancy may be possible and you will be able to get pregnant again. Women who have had this procedure have a higher  chance of having an ectopic pregnancy. An ectopic pregnancy is a pregnancy that happens outside of the uterus. This kind of pregnancy can lead to serious bleeding if it is not treated. What are the benefits?  It is usually effective for a lifetime.  It is usually safe.  It does not have the drawbacks of other types of birth control in that your hormones are not affected. Because of this, your menstrual periods, sexual desire, and sexual performance will not be affected. What are the drawbacks?  You will need to recover and may have complications after surgery.  If you change your mind and decide that you want  to have children, you may not be able to. Sterilization may be reversed, but a reversal is not always successful.  It does not provide protection against STDs (sexually transmitted diseases).  It increases the chance of having an ectopic pregnancy. Follow these instructions at home:  Keep all follow-up visits as told by your health care provider. This is important. Summary  Female sterilization is surgery to prevent pregnancy.  There are different types of female sterilization surgeries.  Sterilization may be reversed, but a reversal is not always successful.  Sterilization does not protect against STDs. This information is not intended to replace advice given to you by your health care provider. Make sure you discuss any questions you have with your health care provider. Document Released: 03/01/2008 Document Revised: 05/25/2018 Document Reviewed: 05/26/2018 Elsevier Patient Education  2020 [employer].

## 2019-07-17 NOTE — Lactation Note (Signed)
This note was copied from a baby's chart. Lactation Consultation Note  Patient Name: Wendy Atkins [id number] Date: 07/17/2019   Baby 80 hours old and sleeping on mother's chest.  Mother states she is tired. Mother is breastfeeding and formula feeding. She is concerned she does not have enough breastmilk but has viewed drops. Explained how infant's get more through bf than hand expression at this time. Encouraged bf before offering formula. Feed on demand with cues.  Goal 8-12+ times per day after first 24 hrs.  Place baby STS if not cueing.  Reviewed engorgement care and monitoring voids/stools.      Maternal Data    Feeding Feeding Type: Bottle Fed - Formula  LATCH Score                   Interventions    Lactation Tools Discussed/Used     Consult Status      Carlye Grippe 07/17/2019, 9:23 AM

## 2019-07-18 DIAGNOSIS — Z3A41 41 weeks gestation of pregnancy: Secondary | ICD-10-CM | POA: Diagnosis not present

## 2019-07-18 DIAGNOSIS — O480 Post-term pregnancy: Secondary | ICD-10-CM | POA: Diagnosis not present

## 2019-08-07 ENCOUNTER — Telehealth: Payer: Self-pay

## 2019-08-07 NOTE — Telephone Encounter (Signed)
Called Pt to advise that we need for her to sign her BTL Consent by 08/10/19.Gave pt address and stated form will be at [employer] for her to sign. Pt verbalized understanding.

## 2019-08-07 NOTE — Telephone Encounter (Signed)
-----   Message from Francia Greaves sent at 08/06/2019 12:04 PM EST ----- Regarding: Needs BTL Consent ASAP Needs BTL consent by Friday 11/13, surgery scheduled for 12/16

## 2019-08-07 NOTE — Telephone Encounter (Signed)
Patient's consent was found and verified by George Hugh that it was okay.  Provider had signed her name on the consent as the witness instead of printing.  She printed her name out beside the signature.  Drema Balzarine said she could use this consent for Medicaid reimbursement.

## 2019-08-09 ENCOUNTER — Telehealth: Payer: Self-pay

## 2019-08-09 NOTE — Telephone Encounter (Signed)
Called pt to advise that Medicaid will pay for BTL. Pt verbalized understanding.

## 2019-08-16 ENCOUNTER — Encounter: Payer: Self-pay | Admitting: Family Medicine

## 2019-08-16 ENCOUNTER — Other Ambulatory Visit: Payer: Self-pay

## 2019-08-16 ENCOUNTER — Ambulatory Visit (INDEPENDENT_AMBULATORY_CARE_PROVIDER_SITE_OTHER): Payer: Medicaid Other | Admitting: Family Medicine

## 2019-08-16 VITALS — BP 119/74 | HR 102 | Wt 255.2 lb

## 2019-08-16 DIAGNOSIS — Z3009 Encounter for other general counseling and advice on contraception: Secondary | ICD-10-CM | POA: Diagnosis not present

## 2019-08-16 NOTE — Patient Instructions (Signed)

## 2019-08-16 NOTE — Progress Notes (Signed)
   Subjective:    Patient ID: Wendy Atkins is a 28 y.o. female presenting with Procedure  on 08/16/2019  HPI: Here today to discuss BTL. Recently pp and scheduled because papers were not old enough to do pp  Review of Systems  Constitutional: Negative for chills and fever.  Respiratory: Negative for shortness of breath.   Cardiovascular: Negative for chest pain.  Gastrointestinal: Negative for abdominal pain, nausea and vomiting.  Genitourinary: Negative for dysuria.  Skin: Negative for rash.      Objective:    BP 119/74   Pulse (!) 102   Wt 255 lb 3.2 oz (115.8 kg)   BMI 39.97 kg/m  Physical Exam Constitutional:      General: She is not in acute distress.    Appearance: She is well-developed.  HENT:     Head: Normocephalic and atraumatic.  Eyes:     General: No scleral icterus. Neck:     Musculoskeletal: Neck supple.  Cardiovascular:     Rate and Rhythm: Normal rate.  Pulmonary:     Effort: Pulmonary effort is normal.  Abdominal:     Palpations: Abdomen is soft.  Skin:    General: Skin is warm and dry.  Neurological:     Mental Status: She is alert and oriented to person, place, and time.         Assessment & Plan:   Problem List Items Addressed This Visit      Unprioritized   Encounter for sterilization education - Primary    For Laparoscopic BTL with Filshie clips, per pt. Preference-->Patient counseled, r.e. Risks benefits of BTL, including permanency of procedure, risk of failure(1:100), increased risk of ectopic.  Patient verbalized understanding and desires to proceed Risks include but are not limited to bleeding, infection, injury to surrounding structures, including bowel, bladder and ureters, blood clots, and death.  Likelihood of success is high.          Total face-to-face time with patient: 25 minutes. Over 50% of encounter was spent on counseling and coordination of care. Return in about 6 weeks (around 09/27/2019) for postop check.   Donnamae Jude 08/16/2019 4:18 PM

## 2019-08-16 NOTE — Assessment & Plan Note (Signed)
For Laparoscopic BTL with Filshie clips, per pt. Preference-->Patient counseled, r.e. Risks benefits of BTL, including permanency of procedure, risk of failure(1:100), increased risk of ectopic.  Patient verbalized understanding and desires to proceed Risks include but are not limited to bleeding, infection, injury to surrounding structures, including bowel, bladder and ureters, blood clots, and death.  Likelihood of success is high.

## 2019-09-07 ENCOUNTER — Other Ambulatory Visit: Payer: Self-pay

## 2019-09-07 ENCOUNTER — Encounter (HOSPITAL_BASED_OUTPATIENT_CLINIC_OR_DEPARTMENT_OTHER): Payer: Self-pay | Admitting: Family Medicine

## 2019-09-07 NOTE — H&P (Signed)
Wendy Atkins is an 28 y.o. [id number] female.   Chief Complaint: undesired fertility HPI: Desires sterilization  Past Medical History:  Diagnosis Date  . Depression    doing good now  . Gonorrhea   . Medical history non-contributory   . Pregnant   . SVD (spontaneous vaginal delivery)    x 3    Past Surgical History:  Procedure Laterality Date  . DILATION AND EVACUATION N/A 08/04/2018   Procedure: DILATATION AND EVACUATION - Ultrasound Guided;  Surgeon: Sloan Leiter, MD;  Location: [facility];  Service: Gynecology;  Laterality: N/A;    Family History  Problem Relation Age of Onset  . Diabetes Mother   . Diabetes Father   . Hypertension Father    Social History:  reports that she quit smoking about 14 months ago. Her smoking use included cigarettes. She has a 2.50 pack-year smoking history. She has never used smokeless tobacco. She reports previous alcohol use of about 2.0 standard drinks of alcohol per week. She reports that she does not use drugs.  Allergies:  Allergies  Allergen Reactions  . Ampicillin Itching    Itching arm while antibiotic infusing Has patient had a PCN reaction causing immediate rash, facial/tongue/throat swelling, SOB or lightheadedness with hypotension: Yes Has patient had a PCN reaction causing severe rash involving mucus membranes or skin necrosis: No Has patient had a PCN reaction that required hospitalization: No Has patient had a PCN reaction occurring within the last 10 years: Yes If all of the above answers are "NO", then may proceed with Cephalosporin use.     No medications prior to admission.    Pertinent items are noted in HPI.  unknown if currently breastfeeding. General appearance: alert, cooperative and appears stated age Head: Normocephalic, without obvious abnormality, atraumatic Neck: supple, symmetrical, trachea midline Lungs: normal effort Heart: regular rate and rhythm Abdomen: soft, non-tender; bowel sounds normal; no masses,   no organomegaly Extremities: Homans sign is negative, no sign of DVT Skin: Skin color, texture, turgor normal. No rashes or lesions Neurologic: Grossly normal   Lab Results  Component Value Date   WBC 12.0 (H) 07/16/2019   HGB 9.4 (L) 07/16/2019   HCT 31.3 (L) 07/16/2019   MCV 92.6 07/16/2019   PLT 197 07/16/2019   Lab Results  Component Value Date   PREGTESTUR POSITIVE (A) 11/18/2018   HCGQUANT 9 09/06/2018     Assessment/Plan Principal Problem:   Encounter for sterilization education  For Laparoscopic BTL with Filshie clips, per pt. Preference-->Patient counseled, r.e. Risks benefits of BTL, including permanency of procedure, risk of failure(1:100), increased risk of ectopic.  Patient verbalized understanding and desires to proceed.  Risks include but are not limited to bleeding, infection, injury to surrounding structures, including bowel, bladder and ureters, blood clots, and death.  Likelihood of success is high.    Donnamae Jude 09/07/2019, 11:00 AM

## 2019-09-08 ENCOUNTER — Other Ambulatory Visit (HOSPITAL_COMMUNITY): Payer: Medicaid Other | Attending: Family Medicine

## 2019-09-10 ENCOUNTER — Other Ambulatory Visit (HOSPITAL_COMMUNITY)
Admission: RE | Admit: 2019-09-10 | Discharge: 2019-09-10 | Disposition: A | Discharge status: Home / Self Care | Payer: Medicaid Other | Source: Ambulatory Visit | Attending: Family Medicine | Admitting: Family Medicine

## 2019-09-10 DIAGNOSIS — Z20828 Contact with and (suspected) exposure to other viral communicable diseases: Secondary | ICD-10-CM | POA: Insufficient documentation

## 2019-09-10 DIAGNOSIS — Z01812 Encounter for preprocedural laboratory examination: Secondary | ICD-10-CM | POA: Insufficient documentation

## 2019-09-10 LAB — SARS CORONAVIRUS 2 (TAT 6-24 HRS): SARS Coronavirus 2: NEGATIVE

## 2019-09-12 ENCOUNTER — Encounter (HOSPITAL_BASED_OUTPATIENT_CLINIC_OR_DEPARTMENT_OTHER)
Admission: RE | Disposition: A | Discharge status: Home / Self Care | Payer: Self-pay | Source: Home / Self Care | Attending: Family Medicine

## 2019-09-12 ENCOUNTER — Ambulatory Visit (HOSPITAL_BASED_OUTPATIENT_CLINIC_OR_DEPARTMENT_OTHER): Payer: Medicaid Other | Admitting: Certified Registered Nurse Anesthetist

## 2019-09-12 ENCOUNTER — Ambulatory Visit (HOSPITAL_BASED_OUTPATIENT_CLINIC_OR_DEPARTMENT_OTHER)
Admission: RE | Admit: 2019-09-12 | Discharge: 2019-09-12 | Disposition: A | Discharge status: Home / Self Care | Payer: Medicaid Other | Attending: Family Medicine | Admitting: Family Medicine

## 2019-09-12 ENCOUNTER — Other Ambulatory Visit: Payer: Self-pay

## 2019-09-12 ENCOUNTER — Encounter (HOSPITAL_BASED_OUTPATIENT_CLINIC_OR_DEPARTMENT_OTHER): Payer: Self-pay | Admitting: Family Medicine

## 2019-09-12 DIAGNOSIS — Z302 Encounter for sterilization: Secondary | ICD-10-CM | POA: Diagnosis not present

## 2019-09-12 DIAGNOSIS — Z3009 Encounter for other general counseling and advice on contraception: Secondary | ICD-10-CM | POA: Diagnosis present

## 2019-09-12 DIAGNOSIS — N760 Acute vaginitis: Secondary | ICD-10-CM | POA: Diagnosis not present

## 2019-09-12 DIAGNOSIS — Z880 Allergy status to penicillin: Secondary | ICD-10-CM | POA: Insufficient documentation

## 2019-09-12 DIAGNOSIS — D649 Anemia, unspecified: Secondary | ICD-10-CM | POA: Diagnosis not present

## 2019-09-12 DIAGNOSIS — F17210 Nicotine dependence, cigarettes, uncomplicated: Secondary | ICD-10-CM | POA: Insufficient documentation

## 2019-09-12 DIAGNOSIS — B9689 Other specified bacterial agents as the cause of diseases classified elsewhere: Secondary | ICD-10-CM | POA: Diagnosis not present

## 2019-09-12 HISTORY — PX: LAPAROSCOPIC TUBAL LIGATION: SHX1937

## 2019-09-12 LAB — CBC
HCT: 36.4 % (ref 36.0–46.0)
Hemoglobin: 11.6 g/dL — ABNORMAL LOW (ref 12.0–15.0)
MCH: 26.7 pg (ref 26.0–34.0)
MCHC: 31.9 g/dL (ref 30.0–36.0)
MCV: 83.7 fL (ref 80.0–100.0)
Platelets: 358 10*3/uL (ref 150–400)
RBC: 4.35 MIL/uL (ref 3.87–5.11)
RDW: 14.6 % (ref 11.5–15.5)
WBC: 5.3 10*3/uL (ref 4.0–10.5)
nRBC: 0 % (ref 0.0–0.2)

## 2019-09-12 LAB — POCT PREGNANCY, URINE: Preg Test, Ur: NEGATIVE

## 2019-09-12 SURGERY — LIGATION, FALLOPIAN TUBE, LAPAROSCOPIC
Anesthesia: General | Laterality: Bilateral

## 2019-09-12 MED ORDER — ACETAMINOPHEN 500 MG PO TABS
1000.0000 mg | ORAL_TABLET | ORAL | Status: AC
  Administered 2019-09-12: 1000 mg via ORAL

## 2019-09-12 MED ORDER — OXYCODONE HCL 5 MG/5ML PO SOLN: 5.0000 mg | Freq: Once | ORAL | Status: DC | PRN

## 2019-09-12 MED ORDER — LACTATED RINGERS IV SOLN: INTRAVENOUS | Status: DC

## 2019-09-12 MED ORDER — FENTANYL CITRATE (PF) 100 MCG/2ML IJ SOLN
INTRAMUSCULAR | Status: AC
  Filled 2019-09-12: qty 2

## 2019-09-12 MED ORDER — SUGAMMADEX SODIUM 200 MG/2ML IV SOLN
INTRAVENOUS | Status: DC | PRN
  Administered 2019-09-12: 350 mg via INTRAVENOUS

## 2019-09-12 MED ORDER — SCOPOLAMINE 1 MG/3DAYS TD PT72
1.0000 | MEDICATED_PATCH | TRANSDERMAL | Status: DC
  Administered 2019-09-12: 1.5 mg via TRANSDERMAL

## 2019-09-12 MED ORDER — FENTANYL CITRATE (PF) 100 MCG/2ML IJ SOLN
INTRAMUSCULAR | Status: DC | PRN
  Administered 2019-09-12 (×2): 50 ug via INTRAVENOUS

## 2019-09-12 MED ORDER — OXYCODONE HCL 5 MG PO TABS: 5.0000 mg | ORAL_TABLET | Freq: Once | ORAL | Status: DC | PRN

## 2019-09-12 MED ORDER — MIDAZOLAM HCL 2 MG/2ML IJ SOLN
INTRAMUSCULAR | Status: AC
  Filled 2019-09-12: qty 2

## 2019-09-12 MED ORDER — SCOPOLAMINE 1 MG/3DAYS TD PT72
MEDICATED_PATCH | TRANSDERMAL | Status: AC
  Filled 2019-09-12: qty 1

## 2019-09-12 MED ORDER — GABAPENTIN 300 MG PO CAPS
ORAL_CAPSULE | ORAL | Status: AC
  Filled 2019-09-12: qty 1

## 2019-09-12 MED ORDER — CEFAZOLIN SODIUM-DEXTROSE 2-4 GM/100ML-% IV SOLN
INTRAVENOUS | Status: AC
  Filled 2019-09-12: qty 100

## 2019-09-12 MED ORDER — KETOROLAC TROMETHAMINE 30 MG/ML IJ SOLN
INTRAMUSCULAR | Status: DC | PRN
  Administered 2019-09-12: 30 mg via INTRAVENOUS

## 2019-09-12 MED ORDER — MIDAZOLAM HCL 2 MG/2ML IJ SOLN
INTRAMUSCULAR | Status: DC | PRN
  Administered 2019-09-12: 2 mg via INTRAVENOUS

## 2019-09-12 MED ORDER — LIDOCAINE 2% (20 MG/ML) 5 ML SYRINGE
INTRAMUSCULAR | Status: AC
  Filled 2019-09-12: qty 5

## 2019-09-12 MED ORDER — FENTANYL CITRATE (PF) 100 MCG/2ML IJ SOLN
25.0000 ug | INTRAMUSCULAR | Status: DC | PRN
  Administered 2019-09-12 (×3): 50 ug via INTRAVENOUS

## 2019-09-12 MED ORDER — PROPOFOL 10 MG/ML IV BOLUS
INTRAVENOUS | Status: AC
  Filled 2019-09-12: qty 20

## 2019-09-12 MED ORDER — BUPIVACAINE HCL (PF) 0.25 % IJ SOLN
INTRAMUSCULAR | Status: DC | PRN
  Administered 2019-09-12: 3 mL
  Administered 2019-09-12: 8 mL

## 2019-09-12 MED ORDER — DEXAMETHASONE SODIUM PHOSPHATE 10 MG/ML IJ SOLN
INTRAMUSCULAR | Status: AC
  Filled 2019-09-12: qty 1

## 2019-09-12 MED ORDER — METOCLOPRAMIDE HCL 5 MG/ML IJ SOLN: 10.0000 mg | Freq: Once | INTRAMUSCULAR | Status: DC | PRN

## 2019-09-12 MED ORDER — KETOROLAC TROMETHAMINE 30 MG/ML IJ SOLN
INTRAMUSCULAR | Status: AC
  Filled 2019-09-12: qty 1

## 2019-09-12 MED ORDER — ONDANSETRON HCL 4 MG/2ML IJ SOLN
INTRAMUSCULAR | Status: DC | PRN
  Administered 2019-09-12: 4 mg via INTRAVENOUS

## 2019-09-12 MED ORDER — ONDANSETRON HCL 4 MG/2ML IJ SOLN
INTRAMUSCULAR | Status: AC
  Filled 2019-09-12: qty 2

## 2019-09-12 MED ORDER — OXYCODONE HCL 5 MG PO TABS: 5.0000 mg | ORAL_TABLET | Freq: Four times a day (QID) | ORAL | 0 refills | Status: DC | PRN

## 2019-09-12 MED ORDER — LIDOCAINE 2% (20 MG/ML) 5 ML SYRINGE
INTRAMUSCULAR | Status: DC | PRN
  Administered 2019-09-12: 100 mg via INTRAVENOUS

## 2019-09-12 MED ORDER — GABAPENTIN 300 MG PO CAPS
300.0000 mg | ORAL_CAPSULE | ORAL | Status: AC
  Administered 2019-09-12: 300 mg via ORAL

## 2019-09-12 MED ORDER — ROCURONIUM BROMIDE 50 MG/5ML IV SOSY
PREFILLED_SYRINGE | INTRAVENOUS | Status: DC | PRN
  Administered 2019-09-12: 50 mg via INTRAVENOUS

## 2019-09-12 MED ORDER — PROPOFOL 10 MG/ML IV BOLUS
INTRAVENOUS | Status: DC | PRN
  Administered 2019-09-12: 200 mg via INTRAVENOUS

## 2019-09-12 MED ORDER — DEXAMETHASONE SODIUM PHOSPHATE 10 MG/ML IJ SOLN
INTRAMUSCULAR | Status: DC | PRN
  Administered 2019-09-12: 10 mg via INTRAVENOUS

## 2019-09-12 MED ORDER — ACETAMINOPHEN 500 MG PO TABS
ORAL_TABLET | ORAL | Status: AC
  Filled 2019-09-12: qty 2

## 2019-09-12 MED ORDER — ROCURONIUM BROMIDE 10 MG/ML (PF) SYRINGE
PREFILLED_SYRINGE | INTRAVENOUS | Status: AC
  Filled 2019-09-12: qty 10

## 2019-09-12 SURGICAL SUPPLY — 29 items
CATH ROBINSON RED A/P 16FR (CATHETERS) IMPLANT
CLIP FILSHIE TUBAL LIGA STRL (Clip) ×3 IMPLANT
DRSG OPSITE POSTOP 3X4 (GAUZE/BANDAGES/DRESSINGS) IMPLANT
DURAPREP 26ML APPLICATOR (WOUND CARE) ×3 IMPLANT
GAUZE 4X4 16PLY RFD (DISPOSABLE) ×3 IMPLANT
GLOVE BIO SURGEON STRL SZ 6.5 (GLOVE) IMPLANT
GLOVE BIO SURGEONS STRL SZ 6.5 (GLOVE)
GLOVE BIOGEL PI IND STRL 7.0 (GLOVE) ×3 IMPLANT
GLOVE BIOGEL PI INDICATOR 7.0 (GLOVE) ×6
GLOVE ECLIPSE 7.0 STRL STRAW (GLOVE) ×3 IMPLANT
GOWN STRL REUS W/ TWL XL LVL3 (GOWN DISPOSABLE) ×1 IMPLANT
GOWN STRL REUS W/TWL LRG LVL3 (GOWN DISPOSABLE) ×3 IMPLANT
GOWN STRL REUS W/TWL XL LVL3 (GOWN DISPOSABLE) ×2
PACK LAPAROSCOPY BASIN (CUSTOM PROCEDURE TRAY) ×3 IMPLANT
PACK TRENDGUARD 450 HYBRID PRO (MISCELLANEOUS) IMPLANT
PACK TRENDGUARD 600 HYBRD PROC (MISCELLANEOUS) IMPLANT
PAD OB MATERNITY 4.3X12.25 (PERSONAL CARE ITEMS) ×3 IMPLANT
PAD PREP 24X48 CUFFED NSTRL (MISCELLANEOUS) ×3 IMPLANT
SET TUBE SMOKE EVAC HIGH FLOW (TUBING) ×3 IMPLANT
SLEEVE SCD COMPRESS KNEE MED (MISCELLANEOUS) ×6 IMPLANT
SLEEVE XCEL OPT CAN 5 100 (ENDOMECHANICALS) IMPLANT
SUT VIC AB 3-0 X1 27 (SUTURE) ×3 IMPLANT
SUT VICRYL 0 UR6 27IN ABS (SUTURE) ×3 IMPLANT
TOWEL GREEN STERILE FF (TOWEL DISPOSABLE) ×6 IMPLANT
TRENDGUARD 450 HYBRID PRO PACK (MISCELLANEOUS) ×3
TRENDGUARD 600 HYBRID PROC PK (MISCELLANEOUS)
TROCAR BALLN 12MMX100 BLUNT (TROCAR) ×3 IMPLANT
TROCAR XCEL NON-BLD 5MMX100MML (ENDOMECHANICALS) IMPLANT
WARMER LAPAROSCOPE (MISCELLANEOUS) ×3 IMPLANT

## 2019-09-12 NOTE — Transfer of Care (Signed)
Immediate Anesthesia Transfer of Care Note  Patient: Wendy Atkins  Procedure(s) Performed: LAPAROSCOPIC TUBAL LIGATION (Bilateral )  Patient Location: PACU  Anesthesia Type:General  Level of Consciousness: awake, alert  and oriented  Airway & Oxygen Therapy: Patient Spontanous Breathing and Patient connected to face mask oxygen  Post-op Assessment: Report given to RN and Post -op Vital signs reviewed and stable  Post vital signs: Reviewed and stable  Last Vitals:  Vitals Value Taken Time  BP    Temp    Pulse 88 09/12/19 1109  Resp 15 09/12/19 1109  SpO2 100 % 09/12/19 1109  Vitals shown include unvalidated device data.  Last Pain:  Vitals:   09/12/19 0859  TempSrc: Oral  PainSc: 0-No pain         Complications: No apparent anesthesia complications

## 2019-09-12 NOTE — Anesthesia Preprocedure Evaluation (Signed)
Anesthesia Evaluation  Patient identified by MRN, date of birth, ID band Patient awake    Reviewed: Allergy & Precautions, NPO status , Patient's Chart, lab work & pertinent test results  Airway Mallampati: II  TM Distance: >3 FB Neck ROM: Full    Dental no notable dental hx. (+) Teeth Intact   Pulmonary former smoker,    Pulmonary exam normal breath sounds clear to auscultation       Cardiovascular negative cardio ROS Normal cardiovascular exam Rhythm:Regular Rate:Normal     Neuro/Psych PSYCHIATRIC DISORDERS Depression negative neurological ROS     GI/Hepatic negative GI ROS, Neg liver ROS,   Endo/Other  Obesity  Renal/GU negative Renal ROS  negative genitourinary   Musculoskeletal negative musculoskeletal ROS (+)   Abdominal (+) + obese,   Peds  Hematology  (+) anemia ,   Anesthesia Other Findings   Reproductive/Obstetrics Undesired fertility                             Anesthesia Physical Anesthesia Plan  ASA: II  Anesthesia Plan: General   Post-op Pain Management:    Induction: Intravenous  PONV Risk Score and Plan: 4 or greater and Ondansetron, Dexamethasone, Scopolamine patch - Pre-op, Midazolam and Treatment may vary due to age or medical condition  Airway Management Planned: Oral ETT  Additional Equipment:   Intra-op Plan:   Post-operative Plan: Extubation in OR  Informed Consent: I have reviewed the patients History and Physical, chart, labs and discussed the procedure including the risks, benefits and alternatives for the proposed anesthesia with the patient or authorized representative who has indicated his/her understanding and acceptance.     Dental advisory given  Plan Discussed with: CRNA and Surgeon  Anesthesia Plan Comments:         Anesthesia Quick Evaluation

## 2019-09-12 NOTE — Anesthesia Procedure Notes (Signed)
Procedure Name: Intubation Date/Time: 09/12/2019 10:22 AM Performed by: Genelle Bal, CRNA Pre-anesthesia Checklist: Patient identified, Emergency Drugs available, Suction available and Patient being monitored Patient Re-evaluated:Patient Re-evaluated prior to induction Oxygen Delivery Method: Circle system utilized Preoxygenation: Pre-oxygenation with 100% oxygen Induction Type: IV induction Ventilation: Mask ventilation without difficulty Laryngoscope Size: Miller and 2 Grade View: Grade I Tube type: Oral Tube size: 7.0 mm Number of attempts: 1 Airway Equipment and Method: Stylet and Oral airway Placement Confirmation: ETT inserted through vocal cords under direct vision,  positive ETCO2 and breath sounds checked- equal and bilateral Secured at: 21 cm Tube secured with: Tape Dental Injury: Teeth and Oropharynx as per pre-operative assessment

## 2019-09-12 NOTE — Discharge Instructions (Signed)
Laparoscopic Tubal Ligation, Care After °This sheet gives you information about how to care for yourself after your procedure. Your health care provider may also give you more specific instructions. If you have problems or questions, contact your health care provider. °What can I expect after the procedure? °After the procedure, it is common to have: °· A sore throat. °· Discomfort in your shoulder. °· Mild discomfort or cramping in your abdomen. °· Gas pains. °· Pain or soreness in the area where the surgical incision was made. °· A bloated feeling. °· Tiredness. °· Nausea. °· Vomiting. °Follow these instructions at home: °Medicines °· Take over-the-counter and prescription medicines only as told by your health care provider. °· Do not take aspirin because it can cause bleeding. °· Ask your health care provider if the medicine prescribed to you: °? Requires you to avoid driving or using heavy machinery. °? Can cause constipation. You may need to take actions to prevent or treat constipation, such as: °§ Drink enough fluid to keep your urine pale yellow. °§ Take over-the-counter or prescription medicines. °§ Eat foods that are high in fiber, such as beans, whole grains, and fresh fruits and vegetables. °§ Limit foods that are high in fat and processed sugars, such as fried or sweet foods. °Incision care ° °  ° °· Follow instructions from your health care provider about how to take care of your incision. Make sure you: °? Wash your hands with soap and water before and after you change your bandage (dressing). If soap and water are not available, use hand sanitizer. °? Change your dressing as told by your health care provider. °? Leave stitches (sutures), skin glue, or adhesive strips in place. These skin closures may need to stay in place for 2 weeks or longer. If adhesive strip edges start to loosen and curl up, you may trim the loose edges. Do not remove adhesive strips completely unless your health care provider  tells you to do that. °· Check your incision area every day for signs of infection. Check for: °? Redness, swelling, or pain. °? Fluid or blood. °? Warmth. °? Pus or a bad smell. °Activity °· Rest as told by your health care provider. °· Avoid sitting for a long time without moving. Get up to take short walks every 1-2 hours. This is important to improve blood flow and breathing. Ask for help if you feel weak or unsteady. °· Return to your normal activities as told by your health care provider. Ask your health care provider what activities are safe for you. °General instructions °· Do not take baths, swim, or use a hot tub until your health care provider approves. Ask your health care provider if you may take showers. You may only be allowed to take sponge baths. °· Have someone help you with your daily household tasks for the first few days. °· Keep all follow-up visits as told by your health care provider. This is important. °Contact a health care provider if: °· You have redness, swelling, or pain around your incision. °· Your incision feels warm to the touch. °· You have pus or a bad smell coming from your incision. °· The edges of your incision break open after the sutures have been removed. °· Your pain does not improve after 2-3 days. °· You have a rash. °· You repeatedly become dizzy or light-headed. °· Your pain medicine is not helping. °Get help right away if you: °· Have a fever. °· Faint. °· Have increasing   pain in your abdomen.  Have severe pain in one or both of your shoulders.  Have fluid or blood coming from your sutures or from your vagina.  Have shortness of breath or difficulty breathing.  Have chest pain or leg pain.  Have ongoing nausea, vomiting, or diarrhea. Summary  After the procedure, it is common to have mild discomfort or cramping in your abdomen.  Take over-the-counter and prescription medicines only as told by your health care provider.  Watch for symptoms that should  prompt you to call your health care provider.  Keep all follow-up visits as told by your health care provider. This is important. This information is not intended to replace advice given to you by your health care provider. Make sure you discuss any questions you have with your health care provider. Document Released: 04/02/2005 Document Revised: 08/08/2018 Document Reviewed: 08/08/2018 Elsevier Patient Education  [place] Instructions  Activity: Get plenty of rest for the remainder of the day. A responsible individual must stay with you for 24 hours following the procedure.  For the next 24 hours, DO NOT: -Drive a car -Paediatric nurse -Drink alcoholic beverages -Take any medication unless instructed by your physician -Make any legal decisions or sign important papers.  Meals: Start with liquid foods such as gelatin or soup. Progress to regular foods as tolerated. Avoid greasy, spicy, heavy foods. If nausea and/or vomiting occur, drink only clear liquids until the nausea and/or vomiting subsides. Call your physician if vomiting continues.  Special Instructions/Symptoms: Your throat may feel dry or sore from the anesthesia or the breathing tube placed in your throat during surgery. If this causes discomfort, gargle with warm salt water. The discomfort should disappear within 24 hours.  If you had a scopolamine patch placed behind your ear for the management of post- operative nausea and/or vomiting:  1. The medication in the patch is effective for 72 hours, after which it should be removed.  Wrap patch in a tissue and discard in the trash. Wash hands thoroughly with soap and water. 2. You may remove the patch earlier than 72 hours if you experience unpleasant side effects which may include dry mouth, dizziness or visual disturbances. 3. Avoid touching the patch. Wash your hands with soap and water after contact with the patch.     No Ibuprofen  until 5:00 pm

## 2019-09-12 NOTE — Op Note (Signed)
Preoperative diagnosis: Multiparity and Undesired fertility  Postoperative diagnosis: Same  Procedure: Laparoscopic BTL  Surgeon: Darron Doom, MD  Anesthesia: Okey Dupre, MD  Findings: Normal uterus tubes and ovaries  Estimated blood loss: Minimal  Complications: None known  Specimens: None  Reason for procedure: 28 y.o. [id number]  who desires permanent sterility. Patient counseled, r.e. Risks benefits of BTL, including permanency of procedure, risk of failure(1:100), increased risk of ectopic.  Patient verbalized understanding and desires to proceed  Procedure: Patient was taken to the operating room was placed in dorsal lithotomy in [place]. She was prepped and draped in the usual sterile fashion. A timeout was performed. SCDs were in place. Speculum was placed inside the vagina. The cervix was visualized and grasped anteriorly with a single-tooth tenaculum. A Hulka tenaculum was placed through the cervix for uterine manipulation. The single tooth tenaculum and speculum were removed from the vagina.  Attention was then turned to the abdomen. Four cc of 0.25% Marcaine was injected at the umbilicus. Two Allis clamps were used to tent up the skin of the umbilicus a vertical one half centimeter incision was made here. The fascia was incised with the knife  And the peritoneum was entered sharply with this incision. A purse string suture of 0-Vicryl on a UR-6 was placed in the fascia. A 10 mm Hassan trocar was placed through this incision and a pneumoperitoneum was created. The patient was then placed in Trendelenburg. The uterus was identified. The right and left tubes were identified and followed to their fimbriated ends. Filshie clips placed across these at 1.5 cm from cornu bilaterally. Pictures were made. Pneumoperitoneum let down and instruments removed from abdomen. Previous mentioned purse string suture tied down. Skin closed with 3-0 Vicryl on an X-1 in a subcuticular  fashion. 6 more cc of local anesthetic injected. Sterile dressing applied.  All instrument, needle and lap counts correct.  Patient awakened and taken to recovery in stable condition.   Donnamae Jude 09/12/2019 11:01 AM

## 2019-09-12 NOTE — Interval H&P Note (Signed)
History and Physical Interval Note:  09/12/2019 9:58 AM  Wendy Atkins  has presented today for surgery, with the diagnosis of Undesired Fertility.  The various methods of treatment have been discussed with the patient and family. After consideration of risks, benefits and other options for treatment, the patient has consented to  Procedure(s): LAPAROSCOPIC TUBAL LIGATION (Bilateral) as a surgical intervention.  The patient's history has been reviewed, patient examined, no change in status, stable for surgery.  I have reviewed the patient's chart and labs.  Questions were answered to the patient's satisfaction.     Donnamae Jude

## 2019-09-12 NOTE — Anesthesia Postprocedure Evaluation (Signed)
Anesthesia Post Note  Patient: Wendy Atkins  Procedure(s) Performed: LAPAROSCOPIC TUBAL LIGATION (Bilateral )     Patient location during evaluation: PACU Anesthesia Type: General Level of consciousness: awake and alert and oriented Pain management: pain level controlled Vital Signs Assessment: post-procedure vital signs reviewed and stable Respiratory status: spontaneous breathing, nonlabored ventilation and respiratory function stable Cardiovascular status: blood pressure returned to baseline and stable Postop Assessment: no apparent nausea or vomiting Anesthetic complications: no    Last Vitals:  Vitals:   09/12/19 1145 09/12/19 1200  BP: 115/76 101/65  Pulse: 73 70  Resp: 14 16  Temp:    SpO2: 97% 100%    Last Pain:  Vitals:   09/12/19 1200  TempSrc:   PainSc: 6                  Wendy Carel A.

## 2019-10-08 ENCOUNTER — Encounter: Payer: Self-pay | Admitting: *Deleted

## 2019-10-18 ENCOUNTER — Ambulatory Visit (INDEPENDENT_AMBULATORY_CARE_PROVIDER_SITE_OTHER): Payer: Medicaid Other | Admitting: Family Medicine

## 2019-10-18 ENCOUNTER — Other Ambulatory Visit (HOSPITAL_COMMUNITY)
Admission: RE | Admit: 2019-10-18 | Discharge: 2019-10-18 | Disposition: A | Discharge status: Home / Self Care | Payer: Medicaid Other | Source: Ambulatory Visit | Attending: Family Medicine | Admitting: Family Medicine

## 2019-10-18 ENCOUNTER — Other Ambulatory Visit: Payer: Self-pay

## 2019-10-18 VITALS — BP 123/78 | HR 101 | Wt 258.0 lb

## 2019-10-18 DIAGNOSIS — Z0000 Encounter for general adult medical examination without abnormal findings: Secondary | ICD-10-CM

## 2019-10-18 DIAGNOSIS — Z01419 Encounter for gynecological examination (general) (routine) without abnormal findings: Secondary | ICD-10-CM | POA: Diagnosis not present

## 2019-10-18 DIAGNOSIS — Z124 Encounter for screening for malignant neoplasm of cervix: Secondary | ICD-10-CM | POA: Diagnosis not present

## 2019-10-18 DIAGNOSIS — Z113 Encounter for screening for infections with a predominantly sexual mode of transmission: Secondary | ICD-10-CM

## 2019-10-18 NOTE — Progress Notes (Signed)
  Subjective:     Wendy Atkins is a 29 y.o. female and is here for a comprehensive physical exam. The patient reports no problems.  The following portions of the patient's history were reviewed and updated as appropriate: allergies, current medications, past family history, past medical history, past social history, past surgical history and problem list.  Review of Systems Pertinent items noted in HPI and remainder of comprehensive ROS otherwise negative.   Objective:    BP 123/78   Pulse (!) 101   Wt 258 lb (117 kg)   BMI 40.41 kg/m  General appearance: alert, cooperative and appears stated age Head: Normocephalic, without obvious abnormality, atraumatic Neck: no adenopathy, supple, symmetrical, trachea midline and thyroid not enlarged, symmetric, no tenderness/mass/nodules Lungs: clear to auscultation bilaterally Breasts: normal appearance, no masses or tenderness Heart: regular rate and rhythm, S1, S2 normal, no murmur, click, rub or gallop Abdomen: soft, non-tender; bowel sounds normal; no masses,  no organomegaly Pelvic: cervix normal in appearance, external genitalia normal, no adnexal masses or tenderness, no cervical motion tenderness, uterus normal size, shape, and consistency and vagina normal without discharge Extremities: extremities normal, atraumatic, no cyanosis or edema Pulses: 2+ and symmetric Skin: Skin color, texture, turgor normal. No rashes or lesions Lymph nodes: Cervical, supraclavicular, and axillary nodes normal. Neurologic: Grossly normal    Assessment:    Healthy female exam.      Plan:   Problem List Items Addressed This Visit    None    Visit Diagnoses    Women's annual routine gynecological examination    -  Primary   Screening for malignant neoplasm of cervix       Relevant Orders   Cytology - PAP( [facility])   Encounter for gynecological examination without abnormal finding       Screen for STD (sexually transmitted disease)       Relevant Orders   Cervicovaginal ancillary only( [place])   HIV Antibody (routine testing w rflx)   RPR   Hepatitis B surface antigen   Hepatitis C Antibody        See After Visit Summary for Counseling Recommendations

## 2019-10-18 NOTE — Patient Instructions (Signed)
 Preventive Care 21-29 Years Old, Female Preventive care refers to visits with your health care provider and lifestyle choices that can promote health and wellness. This includes:  A yearly physical exam. This may also be called an annual well check.  Regular dental visits and eye exams.  Immunizations.  Screening for certain conditions.  Healthy lifestyle choices, such as eating a healthy diet, getting regular exercise, not using drugs or products that contain nicotine and tobacco, and limiting alcohol use. What can I expect for my preventive care visit? Physical exam Your health care provider will check your:  Height and weight. This may be used to calculate body mass index (BMI), which tells if you are at a healthy weight.  Heart rate and blood pressure.  Skin for abnormal spots. Counseling Your health care provider may ask you questions about your:  Alcohol, tobacco, and drug use.  Emotional well-being.  Home and relationship well-being.  Sexual activity.  Eating habits.  Work and work environment.  Method of birth control.  Menstrual cycle.  Pregnancy history. What immunizations do I need?  Influenza (flu) vaccine  This is recommended every year. Tetanus, diphtheria, and pertussis (Tdap) vaccine  You may need a Td booster every 10 years. Varicella (chickenpox) vaccine  You may need this if you have not been vaccinated. Human papillomavirus (HPV) vaccine  If recommended by your health care provider, you may need three doses over 6 months. Measles, mumps, and rubella (MMR) vaccine  You may need at least one dose of MMR. You may also need a second dose. Meningococcal conjugate (MenACWY) vaccine  One dose is recommended if you are age 19-21 years and a first-year college student living in a residence hall, or if you have one of several medical conditions. You may also need additional booster doses. Pneumococcal conjugate (PCV13) vaccine  You may need  this if you have certain conditions and were not previously vaccinated. Pneumococcal polysaccharide (PPSV23) vaccine  You may need one or two doses if you smoke cigarettes or if you have certain conditions. Hepatitis A vaccine  You may need this if you have certain conditions or if you travel or work in places where you may be exposed to hepatitis A. Hepatitis B vaccine  You may need this if you have certain conditions or if you travel or work in places where you may be exposed to hepatitis B. Haemophilus influenzae type b (Hib) vaccine  You may need this if you have certain conditions. You may receive vaccines as individual doses or as more than one vaccine together in one shot (combination vaccines). Talk with your health care provider about the risks and benefits of combination vaccines. What tests do I need?  Blood tests  Lipid and cholesterol levels. These may be checked every 5 years starting at age 20.  Hepatitis C test.  Hepatitis B test. Screening  Diabetes screening. This is done by checking your blood sugar (glucose) after you have not eaten for a while (fasting).  Sexually transmitted disease (STD) testing.  BRCA-related cancer screening. This may be done if you have a family history of breast, ovarian, tubal, or peritoneal cancers.  Pelvic exam and Pap test. This may be done every 3 years starting at age 21. Starting at age 30, this may be done every 5 years if you have a Pap test in combination with an HPV test. Talk with your health care provider about your test results, treatment options, and if necessary, the need for more   tests. Follow these instructions at home: Eating and drinking   Eat a diet that includes fresh fruits and vegetables, whole grains, lean protein, and low-fat dairy.  Take vitamin and mineral supplements as recommended by your health care provider.  Do not drink alcohol if: ? Your health care provider tells you not to drink. ? You are  pregnant, may be pregnant, or are planning to become pregnant.  If you drink alcohol: ? Limit how much you have to 0-1 drink a day. ? Be aware of how much alcohol is in your drink. In the [place] one drink equals one 12 oz bottle of beer (355 mL), one 5 oz glass of wine (148 mL), or one 1 oz glass of hard liquor (44 mL). Lifestyle  Take daily care of your teeth and gums.  Stay active. Exercise for at least 30 minutes on 5 or more days each week.  Do not use any products that contain nicotine or tobacco, such as cigarettes, e-cigarettes, and chewing tobacco. If you need help quitting, ask your health care provider.  If you are sexually active, practice safe sex. Use a condom or other form of birth control (contraception) in order to prevent pregnancy and STIs (sexually transmitted infections). If you plan to become pregnant, see your health care provider for a preconception visit. What's next?  Visit your health care provider once a year for a well check visit.  Ask your health care provider how often you should have your eyes and teeth checked.  Stay up to date on all vaccines. This information is not intended to replace advice given to you by your health care provider. Make sure you discuss any questions you have with your health care provider. Document Revised: 05/25/2018 Document Reviewed: 05/25/2018 Elsevier Patient Education  2020 Elsevier Inc.  

## 2019-10-19 LAB — CERVICOVAGINAL ANCILLARY ONLY
Bacterial Vaginitis (gardnerella): POSITIVE — AB
Candida Glabrata: NEGATIVE
Candida Vaginitis: NEGATIVE
Comment: NEGATIVE
Comment: NEGATIVE
Comment: NEGATIVE
Comment: NEGATIVE
Trichomonas: NEGATIVE

## 2019-10-19 LAB — RPR: RPR Ser Ql: NONREACTIVE

## 2019-10-19 LAB — HEPATITIS B SURFACE ANTIGEN: Hepatitis B Surface Ag: NEGATIVE

## 2019-10-19 LAB — HEPATITIS C ANTIBODY: Hep C Virus Ab: <0.1 s/co ratio (ref 0.0–0.9)

## 2019-10-19 LAB — HIV ANTIBODY (ROUTINE TESTING W REFLEX): HIV Screen 4th Generation wRfx: NONREACTIVE

## 2019-10-22 LAB — CYTOLOGY - PAP
Adequacy: ABSENT
Chlamydia: NEGATIVE
Comment: NEGATIVE
Comment: NORMAL
Diagnosis: NEGATIVE
Neisseria Gonorrhea: NEGATIVE

## 2019-12-04 ENCOUNTER — Encounter: Payer: Self-pay | Admitting: *Deleted

## 2020-01-09 ENCOUNTER — Ambulatory Visit: Payer: Medicaid Other | Admitting: Certified Nurse Midwife

## 2020-02-17 DIAGNOSIS — R1084 Generalized abdominal pain: Secondary | ICD-10-CM | POA: Diagnosis not present

## 2020-02-17 DIAGNOSIS — R079 Chest pain, unspecified: Secondary | ICD-10-CM | POA: Diagnosis not present

## 2020-02-17 DIAGNOSIS — L7634 Postprocedural seroma of skin and subcutaneous tissue following other procedure: Secondary | ICD-10-CM | POA: Diagnosis not present

## 2020-02-17 DIAGNOSIS — R509 Fever, unspecified: Secondary | ICD-10-CM | POA: Diagnosis not present

## 2020-02-17 DIAGNOSIS — A419 Sepsis, unspecified organism: Secondary | ICD-10-CM | POA: Diagnosis not present

## 2020-02-17 DIAGNOSIS — L02212 Cutaneous abscess of back [any part, except buttock]: Secondary | ICD-10-CM | POA: Diagnosis not present

## 2020-02-17 DIAGNOSIS — J9811 Atelectasis: Secondary | ICD-10-CM | POA: Diagnosis not present

## 2020-02-17 DIAGNOSIS — R109 Unspecified abdominal pain: Secondary | ICD-10-CM | POA: Diagnosis not present

## 2020-02-17 DIAGNOSIS — R590 Localized enlarged lymph nodes: Secondary | ICD-10-CM | POA: Diagnosis not present

## 2020-02-18 DIAGNOSIS — Z049 Encounter for examination and observation for unspecified reason: Secondary | ICD-10-CM | POA: Diagnosis not present

## 2020-02-18 DIAGNOSIS — A419 Sepsis, unspecified organism: Secondary | ICD-10-CM | POA: Diagnosis not present

## 2020-02-18 DIAGNOSIS — T8140XA Infection following a procedure, unspecified, initial encounter: Secondary | ICD-10-CM | POA: Diagnosis not present

## 2020-02-18 DIAGNOSIS — A409 Streptococcal sepsis, unspecified: Secondary | ICD-10-CM | POA: Diagnosis not present

## 2020-02-18 DIAGNOSIS — R509 Fever, unspecified: Secondary | ICD-10-CM | POA: Diagnosis not present

## 2020-02-18 DIAGNOSIS — T8144XA Sepsis following a procedure, initial encounter: Secondary | ICD-10-CM | POA: Diagnosis not present

## 2020-02-18 DIAGNOSIS — L02212 Cutaneous abscess of back [any part, except buttock]: Secondary | ICD-10-CM | POA: Diagnosis not present

## 2020-02-18 DIAGNOSIS — R109 Unspecified abdominal pain: Secondary | ICD-10-CM | POA: Diagnosis not present

## 2020-02-18 MED ORDER — ONDANSETRON HCL 4 MG/2ML IJ SOLN
4.00 | INTRAMUSCULAR | Status: DC
Start: ? — End: 2020-02-18

## 2020-02-18 MED ORDER — ACETAMINOPHEN 325 MG PO TABS
650.00 | ORAL_TABLET | ORAL | Status: DC
Start: 2020-02-18 — End: 2020-02-18

## 2020-02-18 MED ORDER — CLINDAMYCIN PHOSPHATE IN D5W 600 MG/50ML IV SOLN
600.00 | INTRAVENOUS | Status: DC
Start: 2020-02-18 — End: 2020-02-18

## 2020-02-18 MED ORDER — ENOXAPARIN SODIUM 40 MG/0.4ML ~~LOC~~ SOLN
40.00 | SUBCUTANEOUS | Status: DC
Start: 2020-02-18 — End: 2020-02-18

## 2020-02-18 MED ORDER — SODIUM CHLORIDE 0.9 % IV SOLN
75.00 | INTRAVENOUS | Status: DC
Start: ? — End: 2020-02-18

## 2020-02-18 MED ORDER — TRAMADOL HCL 50 MG PO TABS
50.00 | ORAL_TABLET | ORAL | Status: DC
Start: ? — End: 2020-02-18

## 2020-02-18 MED ORDER — DIPHENHYDRAMINE HCL 25 MG PO CAPS
25.00 | ORAL_CAPSULE | ORAL | Status: DC
Start: ? — End: 2020-02-18

## 2020-02-18 MED ORDER — OXYCODONE-ACETAMINOPHEN 5-325 MG PO TABS
1.00 | ORAL_TABLET | ORAL | Status: DC
Start: ? — End: 2020-02-18

## 2020-02-18 MED ORDER — MORPHINE SULFATE 4 MG/ML IJ SOLN
4.00 | INTRAMUSCULAR | Status: DC
Start: ? — End: 2020-02-18

## 2020-02-18 MED ORDER — MORPHINE SULFATE (PF) 2 MG/ML IV SOLN
2.00 | INTRAVENOUS | Status: DC
Start: ? — End: 2020-02-18

## 2020-02-19 DIAGNOSIS — R509 Fever, unspecified: Secondary | ICD-10-CM | POA: Diagnosis not present

## 2020-02-19 DIAGNOSIS — L02212 Cutaneous abscess of back [any part, except buttock]: Secondary | ICD-10-CM | POA: Diagnosis not present

## 2020-02-19 DIAGNOSIS — A419 Sepsis, unspecified organism: Secondary | ICD-10-CM | POA: Diagnosis not present

## 2020-02-25 IMAGING — US US OB < 14 WEEKS - US OB TV
1 series · 15 of 28 positions shown · non-contrast
Comparison: None.

CLINICAL DATA: Abdominal and back pain today. Positive pregnancy
test. Gestational age by ultrasound 8 weeks and 0 days.

EXAM:
OBSTETRIC <14 WK US AND TRANSVAGINAL OB US
TECHNIQUE: Both transabdominal and transvaginal ultrasound examinations were
performed for complete evaluation of the gestation as well as the
maternal uterus, adnexal regions, and pelvic cul-de-sac.
Transvaginal technique was performed to assess early pregnancy.

[Series 1: us ob < 14 weeks - us ob tv · 15 of 67 slices shown]
[im 1/67]
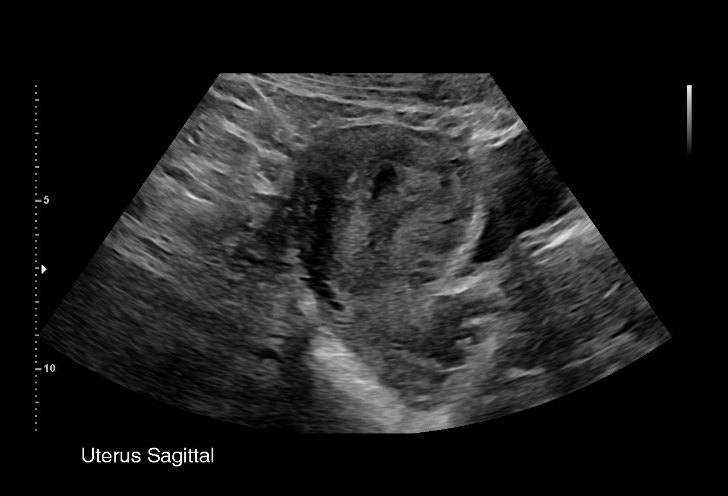
[im 5/67]
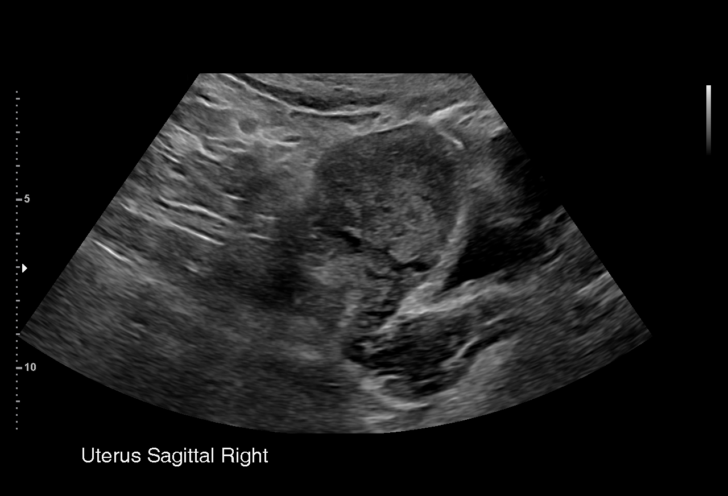
[im 10/67]
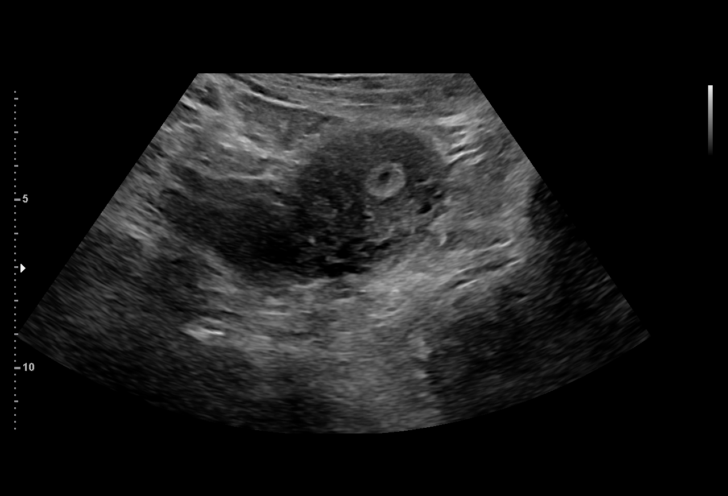
[im 15/67]
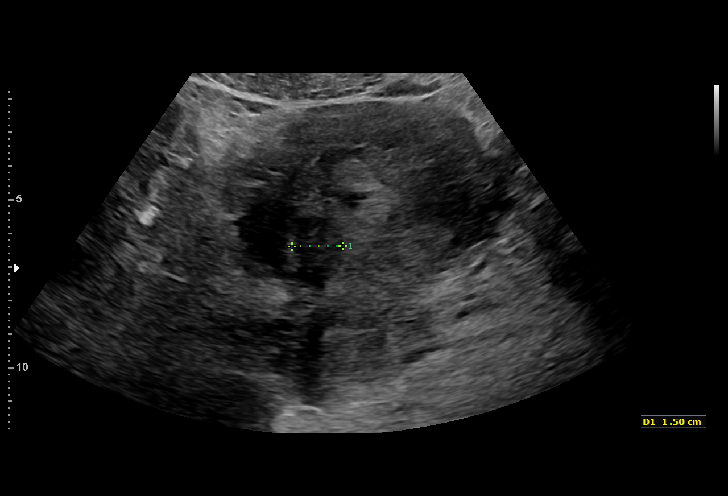
[im 20/67]
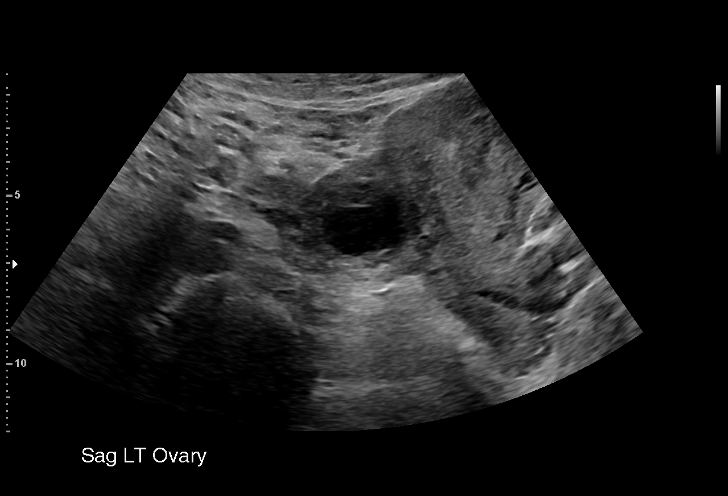
[im 25/67]
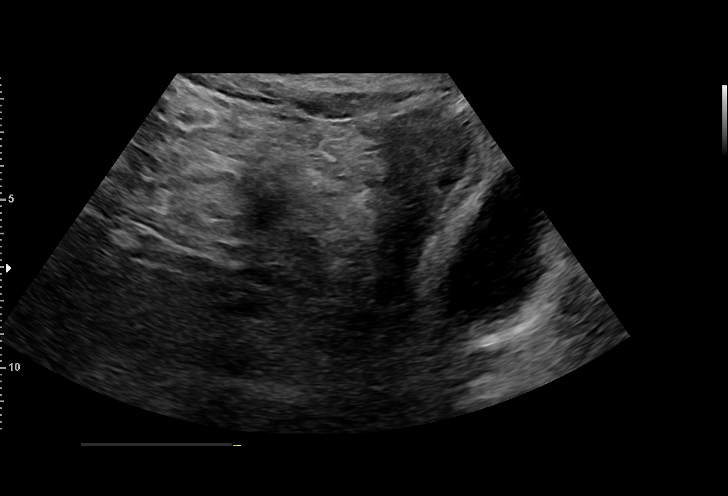
[im 30/67]
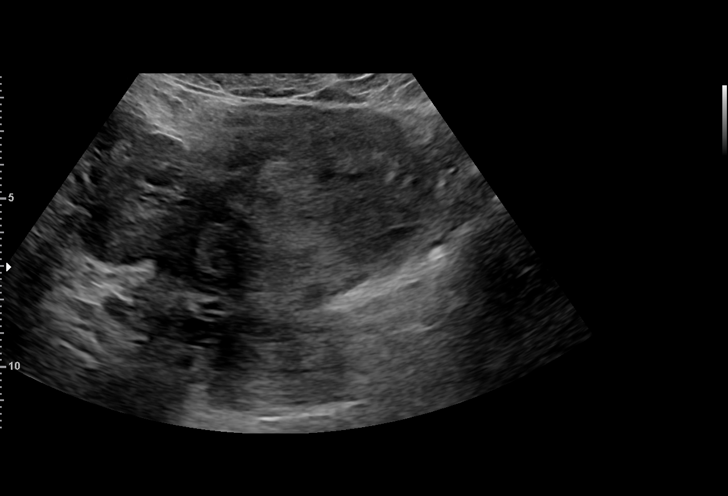
[im 35/67]
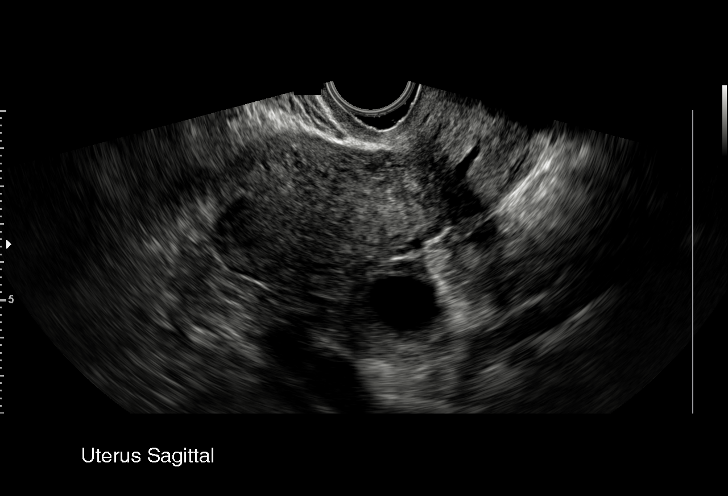
[im 37/67]
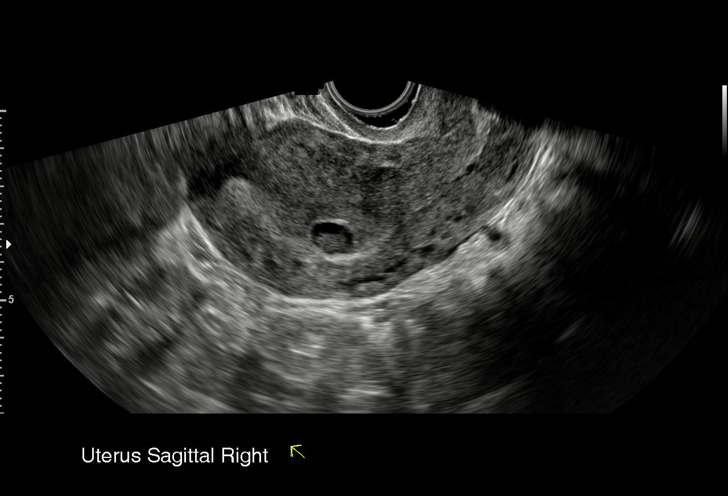
[im 42/67]
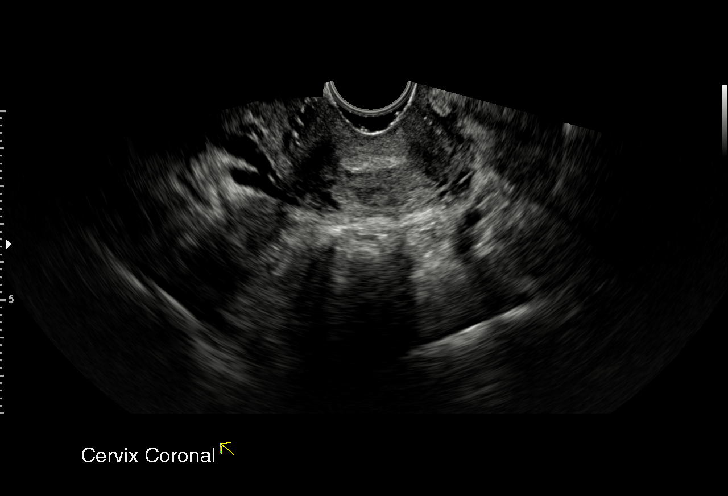
[im 47/67]
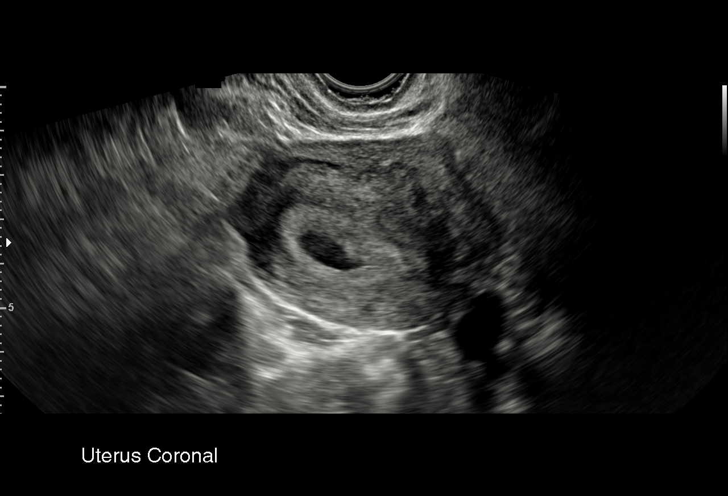
[im 52/67]
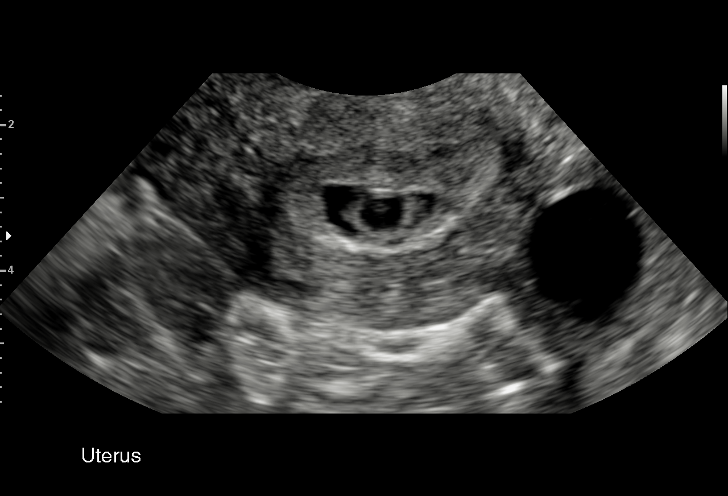
[im 57/67]
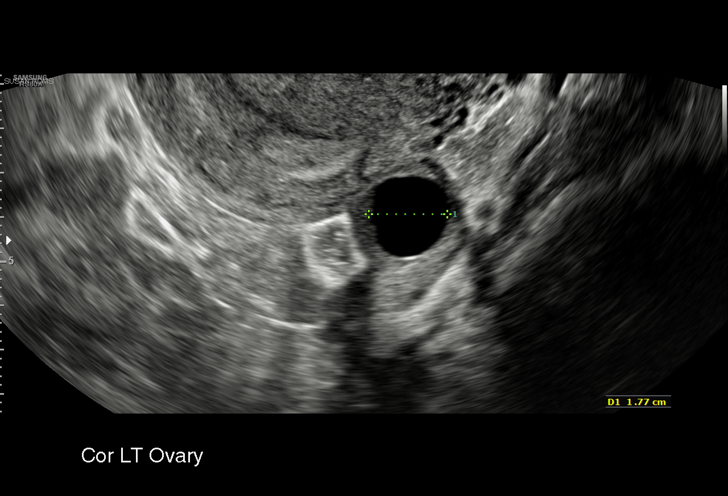
[im 62/67]
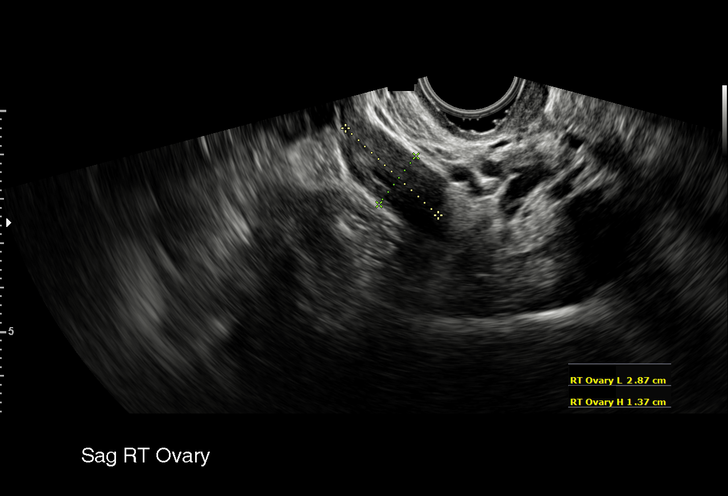
[im 67/67]
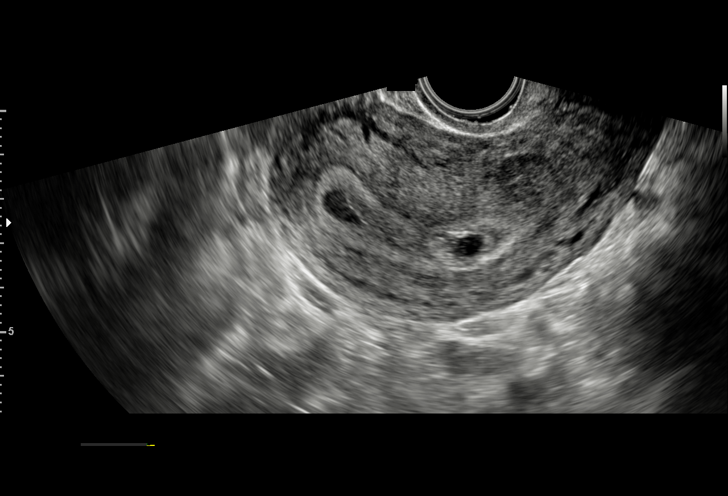

[15 of 28 positions shown; findings below may reference images not displayed]

FINDINGS: Intrauterine gestational sac: Small amount of free fluid in upper
endometrial cavity versus gestational sac. Second cystic 10 mm
potential gestational sac with atypical thick walls.

Yolk sac:  Not present.

Embryo:  Not present.

Cardiac Activity: Not present.

MSD: 10 mm   5 w   5 d

Subchorionic hemorrhage:  Small suspected subchorionic hemorrhage.

Maternal uterus/adnexae: Normal appearance of the adnexa. No free
fluid. Intra-ovarian LEFT corpus luteal cyst. 2 cm RIGHT intramural
leiomyoma.
IMPRESSION: Probable early intrauterine gestational sac(s), but no yolk sac,
fetal pole, or cardiac activity yet visualized. Small suspected
subchorionic hemorrhage. Recommend follow-up quantitative B-HCG
levels and follow-up US in 14 days to assess viability. This
recommendation follows SRU consensus guidelines: Diagnostic Criteria
for Nonviable Pregnancy Early in the First Trimester. N Engl J Med

## 2020-03-04 DIAGNOSIS — Z4800 Encounter for change or removal of nonsurgical wound dressing: Secondary | ICD-10-CM | POA: Diagnosis not present

## 2020-04-01 DIAGNOSIS — L02212 Cutaneous abscess of back [any part, except buttock]: Secondary | ICD-10-CM | POA: Diagnosis not present

## 2020-05-27 ENCOUNTER — Ambulatory Visit: Payer: Medicaid Other

## 2020-05-28 ENCOUNTER — Other Ambulatory Visit: Payer: Self-pay

## 2020-05-28 ENCOUNTER — Other Ambulatory Visit (HOSPITAL_COMMUNITY)
Admission: RE | Admit: 2020-05-28 | Discharge: 2020-05-28 | Disposition: A | Discharge status: Home / Self Care | Payer: Medicaid Other | Source: Ambulatory Visit | Attending: Obstetrics and Gynecology | Admitting: Obstetrics and Gynecology

## 2020-05-28 ENCOUNTER — Ambulatory Visit (INDEPENDENT_AMBULATORY_CARE_PROVIDER_SITE_OTHER): Payer: Medicaid Other | Admitting: Lactation Services

## 2020-05-28 VITALS — BP 126/71 | HR 102 | Ht 67.0 in | Wt 231.7 lb

## 2020-05-28 DIAGNOSIS — N898 Other specified noninflammatory disorders of vagina: Secondary | ICD-10-CM | POA: Diagnosis not present

## 2020-05-28 NOTE — Patient Instructions (Signed)
Patient presents with concerns of vaginal discharge. She reports discharge is a light brown with foul odor. Fishy and sweet. She has no itching or burning.

## 2020-05-28 NOTE — Progress Notes (Signed)
Agree with A & P. 

## 2020-05-28 NOTE — Progress Notes (Signed)
Patient reports brown and smelly discharge. She reports the discharge smells fishy and sweet. She reports she just finished her cycle. Informed the her brown coloration os most likely from her cycle. Vaginal swab obtained by patient. She was informed that results should be back in about 48 hours and will prescribe medications as needed. Patient voiced understanding.

## 2020-05-29 ENCOUNTER — Telehealth (INDEPENDENT_AMBULATORY_CARE_PROVIDER_SITE_OTHER): Payer: Medicaid Other

## 2020-05-29 DIAGNOSIS — B9689 Other specified bacterial agents as the cause of diseases classified elsewhere: Secondary | ICD-10-CM

## 2020-05-29 DIAGNOSIS — N760 Acute vaginitis: Secondary | ICD-10-CM

## 2020-05-29 LAB — CERVICOVAGINAL ANCILLARY ONLY
Bacterial Vaginitis (gardnerella): POSITIVE — AB
Candida Glabrata: NEGATIVE
Candida Vaginitis: NEGATIVE
Chlamydia: NEGATIVE
Comment: NEGATIVE
Comment: NEGATIVE
Comment: NEGATIVE
Comment: NEGATIVE
Comment: NEGATIVE
Comment: NORMAL
Neisseria Gonorrhea: NEGATIVE
Trichomonas: NEGATIVE

## 2020-05-29 MED ORDER — METRONIDAZOLE 500 MG PO TABS: 500.0000 mg | ORAL_TABLET | Freq: Two times a day (BID) | ORAL | 0 refills | Status: DC

## 2020-05-29 NOTE — Telephone Encounter (Addendum)
-----   Message from Hermina Staggers, MD sent at 05/29/2020  2:07 PM EDT ----- Please send in Rx fo Flagyl for BV Pt is aware.   Called pt; positive BV result given. Explained Flagyl antibiotic has been sent to her pharmacy. Instructed pt to take BID for 7 days with food and to avoid alcohol. Pt verbalizes understanding.

## 2020-07-18 IMAGING — US US OB < 14 WEEKS - US OB TV
1 series · 15 of 28 positions shown · non-contrast
Comparison: None.

CLINICAL DATA: 27-year-old pregnant female with vaginal bleeding.
LMP: 10/01/2018 corresponding to an estimated gestational age of 6
weeks, 6 days.

EXAM:
OBSTETRIC <14 WK US AND TRANSVAGINAL OB US
TECHNIQUE: Both transabdominal and transvaginal ultrasound examinations were
performed for complete evaluation of the gestation as well as the
maternal uterus, adnexal regions, and pelvic cul-de-sac.
Transvaginal technique was performed to assess early pregnancy.

[Series 1: us ob < 14 weeks - us ob tv · 15 of 40 slices shown]
[im 1/40]
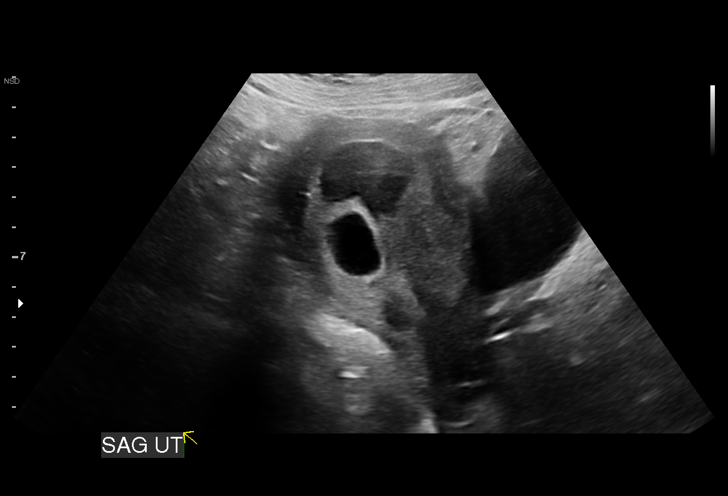
[im 3/40]
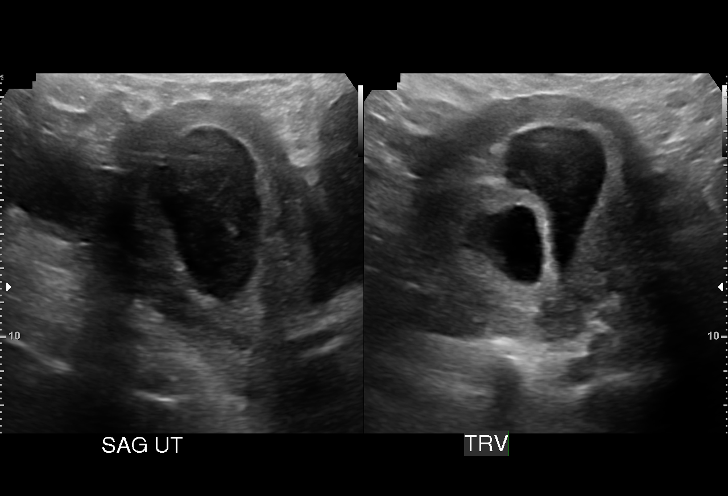
[im 6/40]
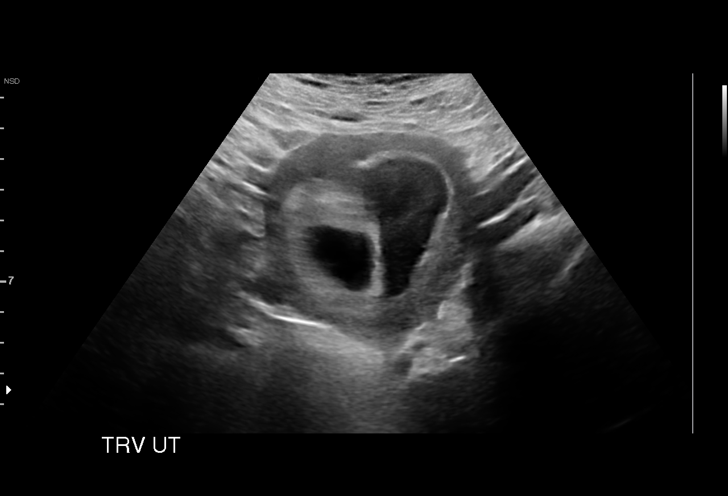
[im 9/40]
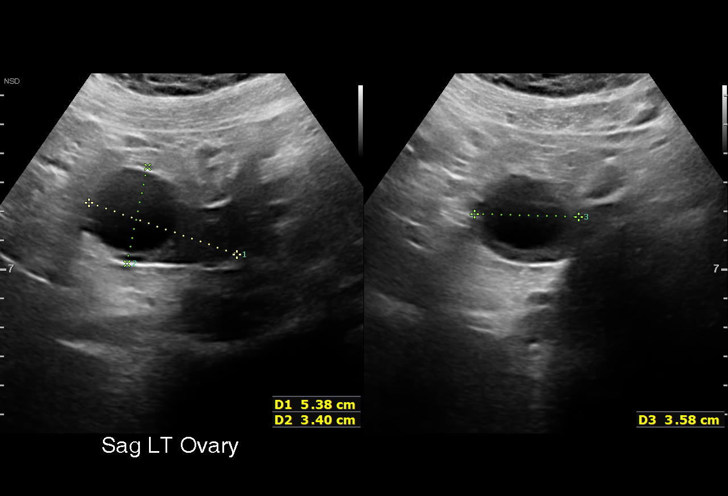
[im 12/40]
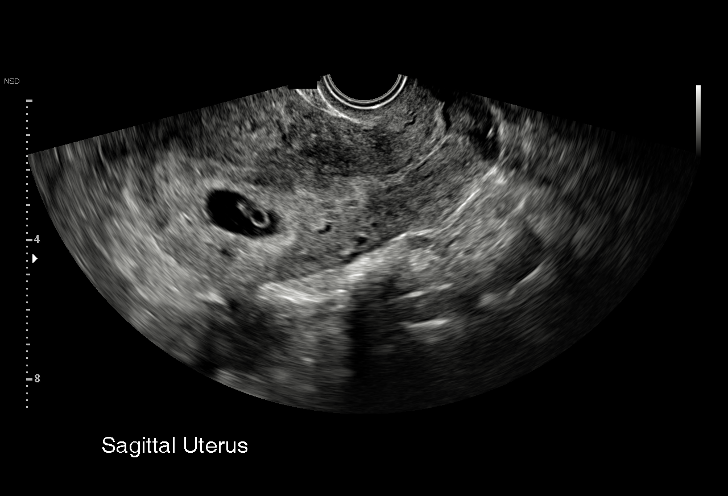
[im 15/40]
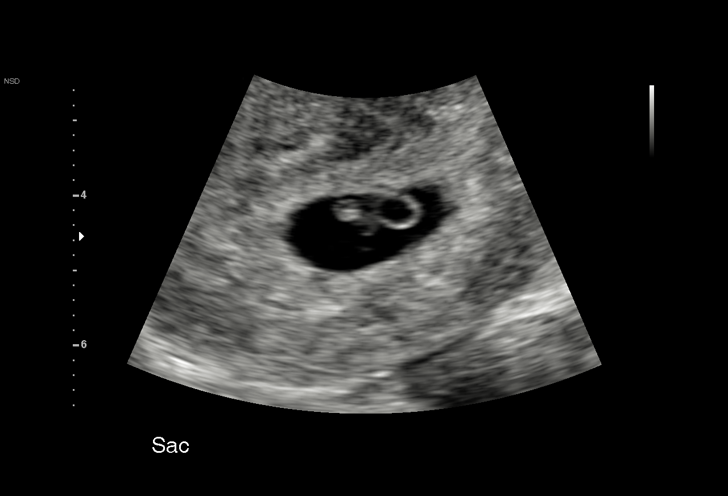
[im 18/40]
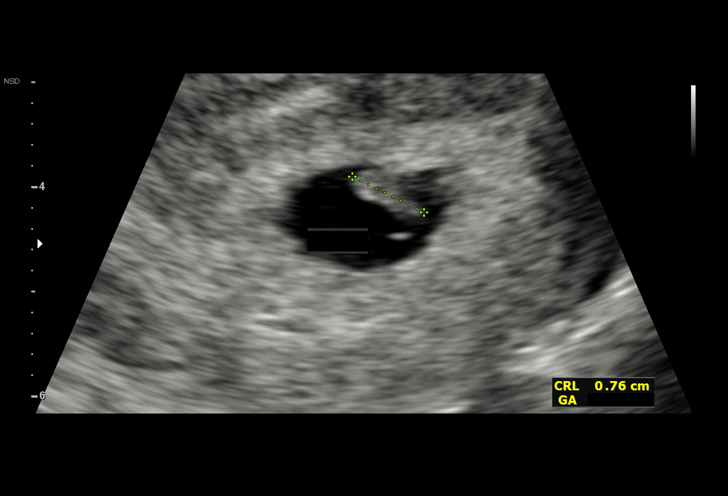
[im 21/40]
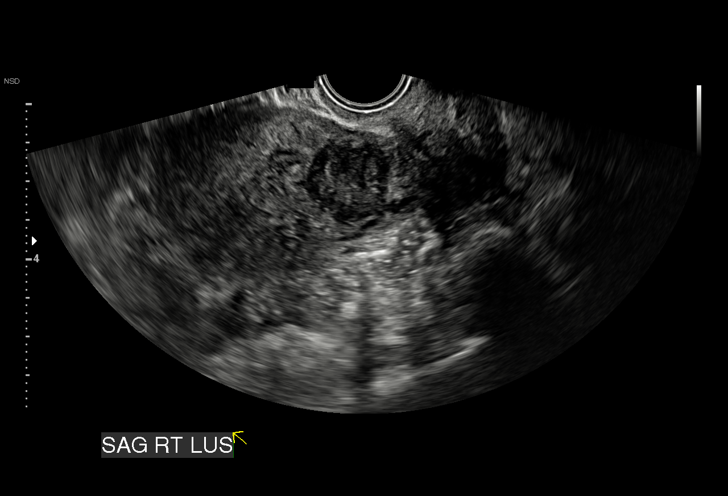
[im 22/40]
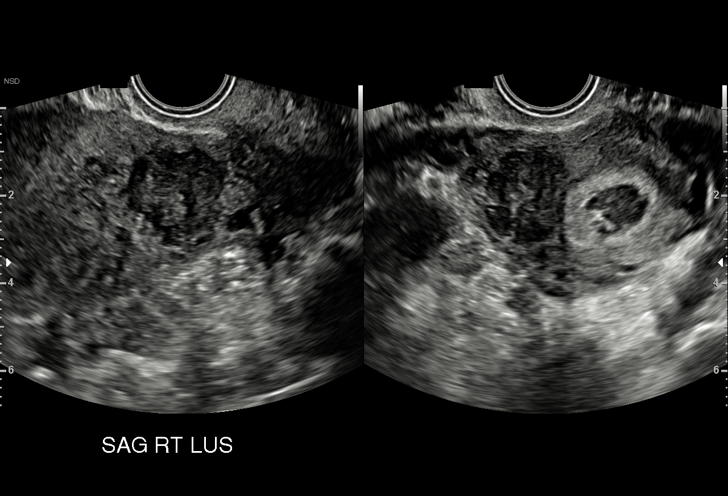
[im 25/40]
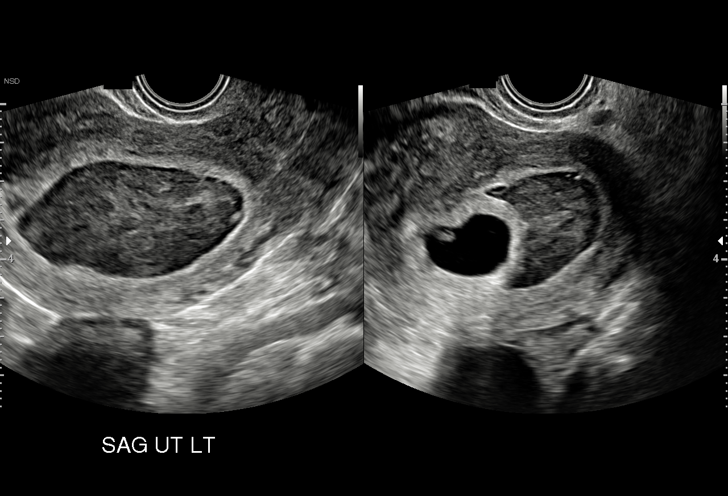
[im 28/40]
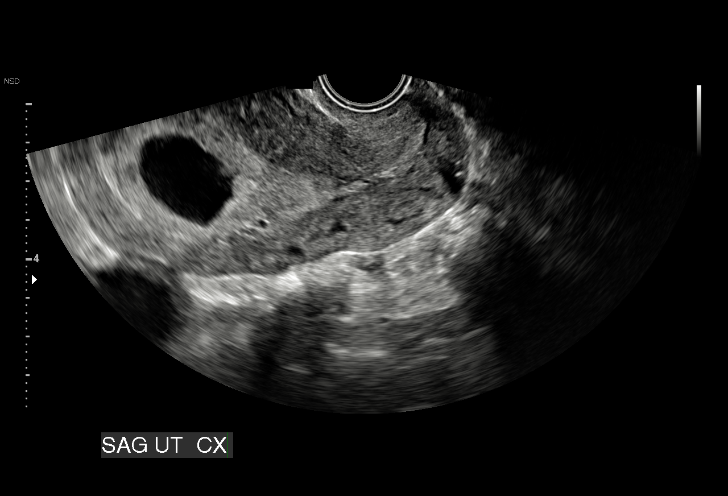
[im 31/40]
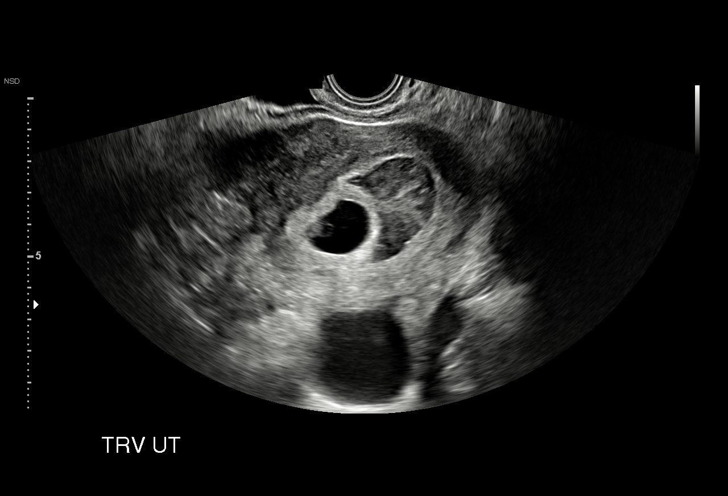
[im 34/40]
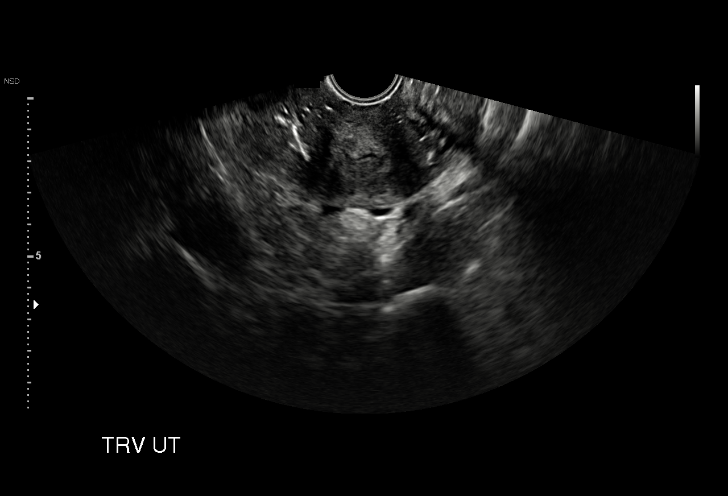
[im 37/40]
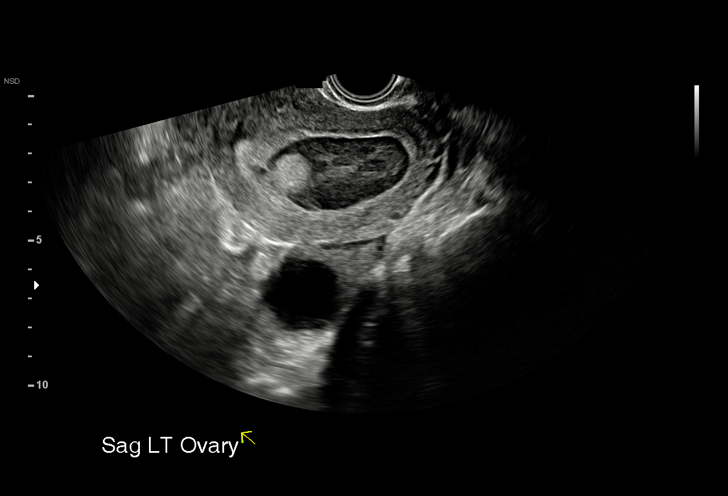
[im 40/40]
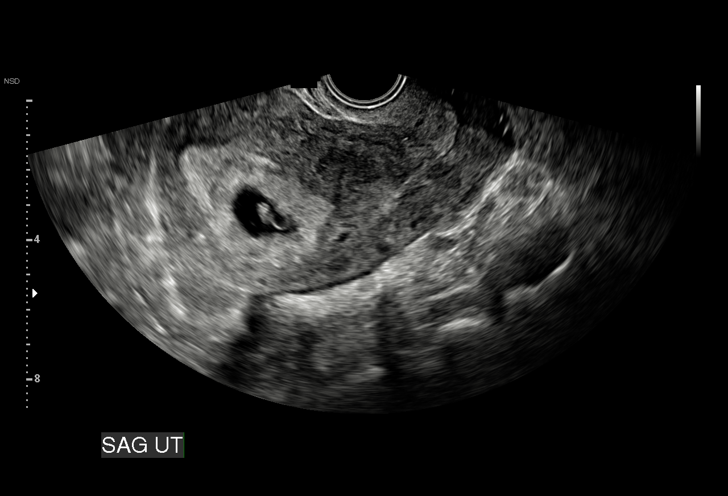

[15 of 28 positions shown; findings below may reference images not displayed]

FINDINGS: Intrauterine gestational sac: Single intrauterine gestational sac.

Yolk sac:  Seen

Embryo:  Present

Cardiac Activity: Detected

Heart Rate: 121 bpm

CRL:  8 mm   6 w   4 d                  US EDC: 07/10/2019

Subchorionic hemorrhage: There is a large subchorionic hemorrhage to
the left of the gestational sac and measuring approximately 6 x 3 x
3 cm and abutting 40% of the circumference of the gestational sac.

Maternal uterus/adnexae: There is a 2.3 x 2.4 x 1.8 cm right lower
uterine fibroid. The ovaries are unremarkable. No significant free
fluid within the pelvis.
IMPRESSION: 1. Single live intrauterine pregnancy with an estimated gestational
age of 6 weeks, 4 days.
2. Large subchorionic hemorrhage.

## 2020-07-25 ENCOUNTER — Other Ambulatory Visit: Payer: Self-pay

## 2020-07-25 ENCOUNTER — Other Ambulatory Visit (HOSPITAL_COMMUNITY)
Admission: RE | Admit: 2020-07-25 | Discharge: 2020-07-25 | Disposition: A | Discharge status: Home / Self Care | Payer: Medicaid Other | Source: Ambulatory Visit | Attending: Obstetrics and Gynecology | Admitting: Obstetrics and Gynecology

## 2020-07-25 ENCOUNTER — Ambulatory Visit (INDEPENDENT_AMBULATORY_CARE_PROVIDER_SITE_OTHER): Payer: Medicaid Other

## 2020-07-25 VITALS — BP 116/78 | HR 81

## 2020-07-25 DIAGNOSIS — N898 Other specified noninflammatory disorders of vagina: Secondary | ICD-10-CM

## 2020-07-25 NOTE — Progress Notes (Signed)
Chart reviewed for nurse visit. Agree with plan of care.   Marny Lowenstein, PA-C 07/25/2020 12:05 PM

## 2020-07-25 NOTE — Progress Notes (Signed)
Pt here today with c/o vaginal discharge with odor and that had unprotected sex about a week ago.  Pt reports that she feels as its what she had in the past, BV.  Pt explained on how to obtain self swab and that we will call in 24-48hrs not including the weekend.  Pt verbalized understanding with no further questions.  Addison Naegeli, RN  07/25/20

## 2020-07-28 LAB — CERVICOVAGINAL ANCILLARY ONLY
Bacterial Vaginitis (gardnerella): NEGATIVE
Candida Glabrata: NEGATIVE
Candida Vaginitis: POSITIVE — AB
Chlamydia: NEGATIVE
Comment: NEGATIVE
Comment: NEGATIVE
Comment: NEGATIVE
Comment: NEGATIVE
Comment: NEGATIVE
Comment: NORMAL
Neisseria Gonorrhea: NEGATIVE
Trichomonas: NEGATIVE

## 2020-10-14 IMAGING — US US MFM OB DETAIL +14 WK
1 series · 13 of 28 positions shown · non-contrast
Comparison: none

[Series 1: us mfm ob detail +14 wk · 13 of 76 slices shown]
[im 3/76]
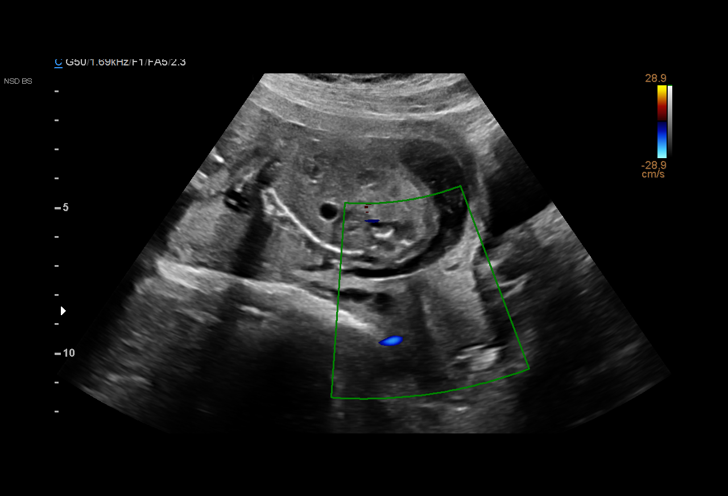
[im 9/76]
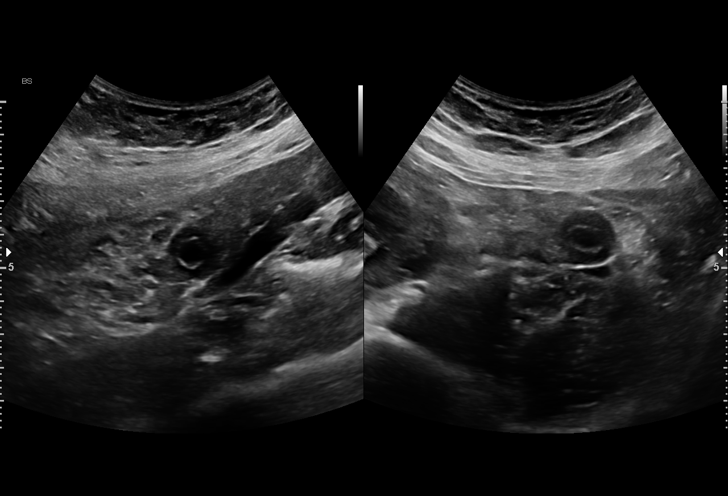
[im 14/76]
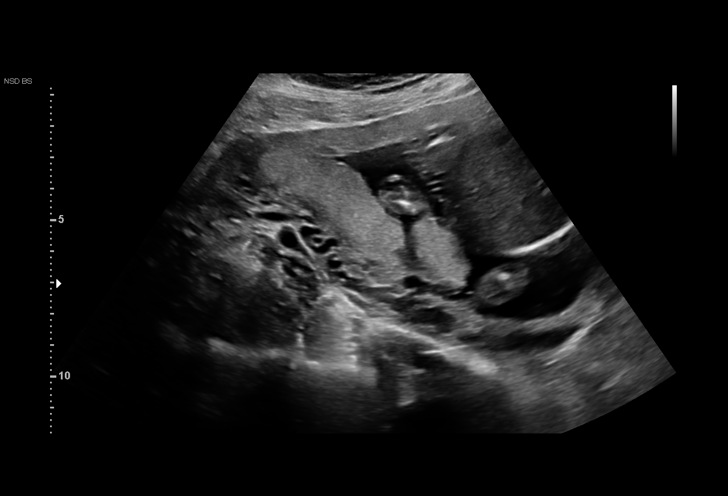
[im 20/76]
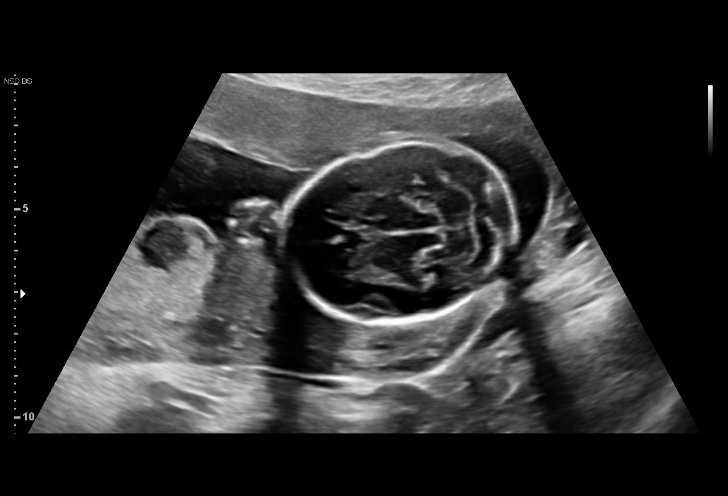
[im 26/76]
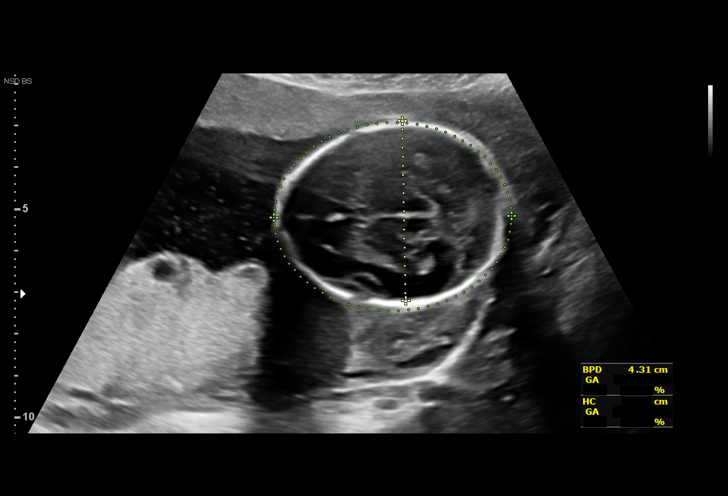
[im 31/76]
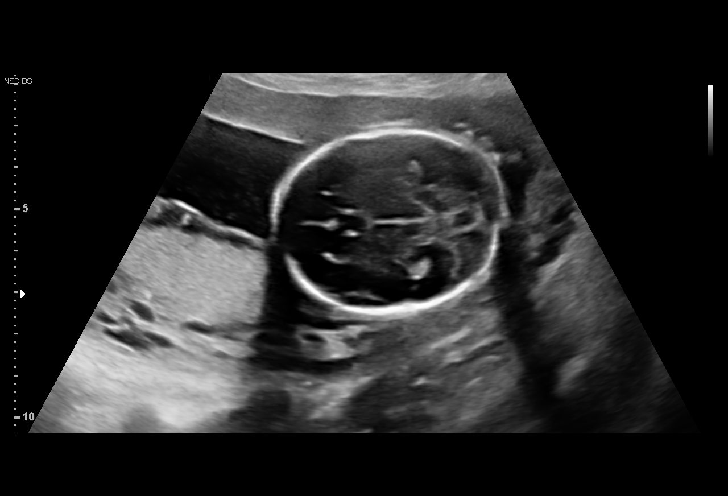
[im 39/76]
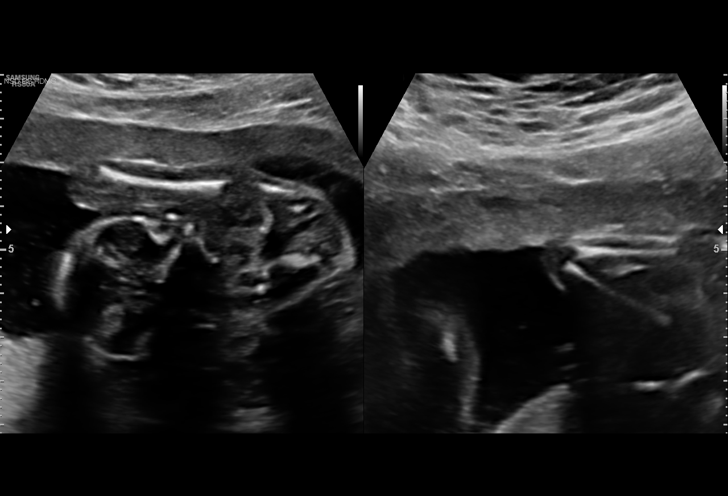
[im 45/76]
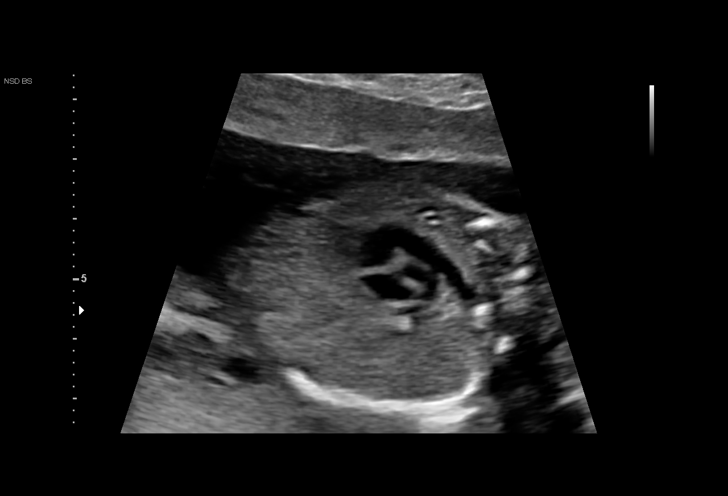
[im 51/76]
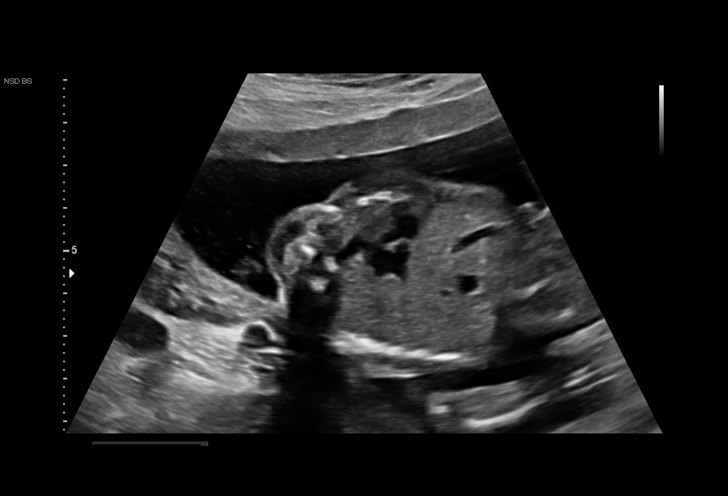
[im 56/76]
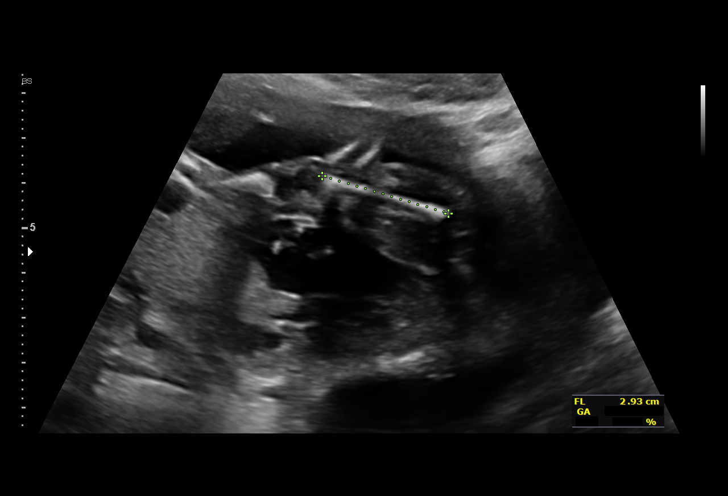
[im 62/76]
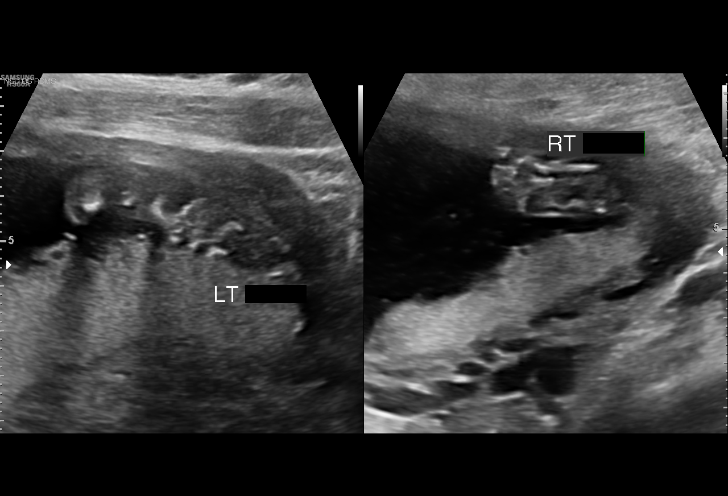
[im 67/76]
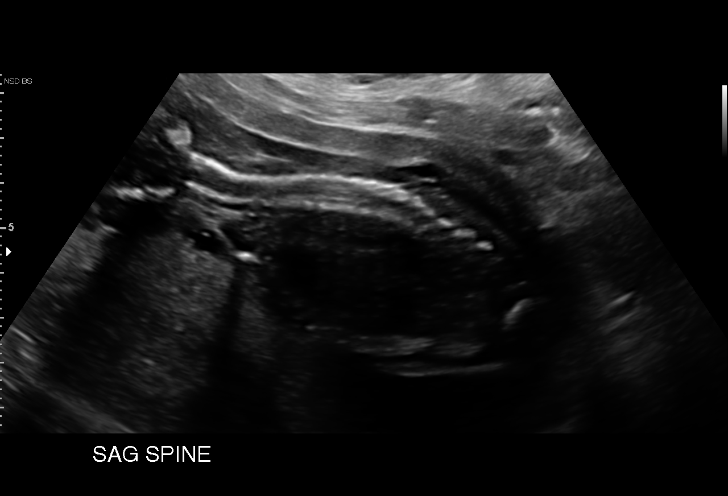
[im 73/76]
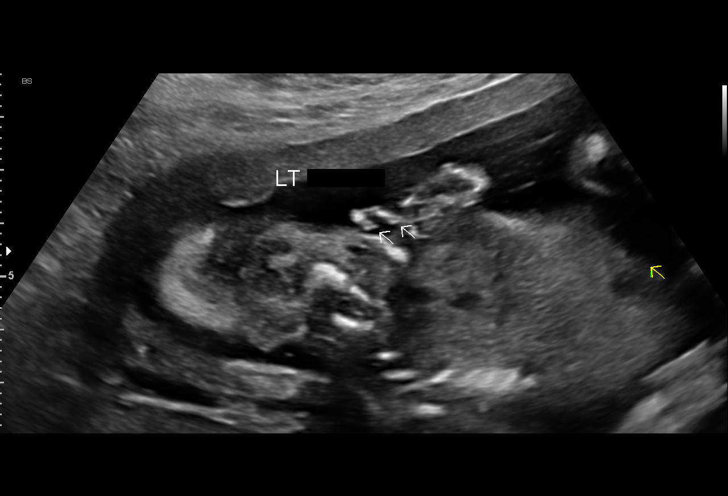

[13 of 28 positions shown; findings below may reference images not displayed]

[address]

 ----------------------------------------------------------------------

 ----------------------------------------------------------------------
Indications

  Encounter for antenatal screening for
  malformations
  19 weeks gestation of pregnancy
  History of fetal abnormality in previous
  pregnancy, currently pregnant (Bone missing
  in thumb, removed surgically)
  Obesity complicating pregnancy, second
  trimester (Pregravid BMI 38)
 ----------------------------------------------------------------------
Vital Signs

 BMI:
Fetal Evaluation

 Num Of Fetuses:          1
 Fetal Heart Rate(bpm):   158
 Cardiac Activity:        Observed
 Presentation:            Breech
 Placenta:                Posterior
 P. Cord Insertion:       Visualized

 Amniotic Fluid
 AFI FV:      Within normal limits

                             Largest Pocket(cm)

Biometry

 BPD:      43.1  mm     G. Age:  19w 0d         46  %    CI:        72.19   %    70 - 86
                                                         FL/HC:       18.7  %    16.1 -
 HC:      161.4  mm     G. Age:  19w 0d         33  %    HC/AC:       1.11       1.09 -
 AC:       145   mm     G. Age:  19w 6d         68  %    FL/BPD:      70.1  %
 FL:       30.2  mm     G. Age:  19w 3d         50  %    FL/AC:       20.8  %    20 - 24
 HUM:      28.3  mm     G. Age:  19w 1d         50  %
 CER:      20.1  mm     G. Age:  19w 1d         49  %
 NFT:       4.7  mm
 LV:        5.6  mm
 CM:        3.9  mm

 Est. FW:     297   gm   0 lb 10 oz      52  %
OB History

 Gravidity:    6         Term:   3        Prem:   0        SAB:   2
 TOP:          0       Ectopic:  0        Living: 3
Gestational Age

 U/S Today:     19w 2d                                        EDD:   07/09/19
 Best:          19w 1d     Det. By:  Early Ultrasound         EDD:   07/10/19
                                     (11/18/18)
Anatomy

 Cranium:               Appears normal         LVOT:                   Appears normal
 Cavum:                 Appears normal         Aortic Arch:            Appears normal
 Ventricles:            Appears normal         Ductal Arch:            Previously seen
 Choroid Plexus:        Appears normal         Diaphragm:              Appears normal
 Cerebellum:            Appears normal         Stomach:                Appears normal, left
                                                                       sided
 Posterior Fossa:       Previously seen        Abdomen:                Appears normal
 Nuchal Fold:           Previously seen        Abdominal Wall:         Not well visualized
 Face:                  Appears normal         Cord Vessels:           Appears normal (3
                        (orbits and profile)                           vessel cord)
 Lips:                  Appears normal         Kidneys:                Appear normal
 Palate:                Not well visualized    Bladder:                Appears normal
 Thoracic:              Appears normal         Spine:                  Limited views
                                                                       appear normal
 Heart:                 Not well visualized    Upper Extremities:      LUE visualized
 RVOT:                  Appears normal         Lower Extremities:      Visualized

 Other:  Fetus appears to be a male. Heels and 5th digit visualized. Nasal
         bone visualized.
Cervix Uterus Adnexa

 Cervix
 Length:           3.02  cm.
 Normal appearance by transabdominal scan.

 Left Ovary
 Within normal limits.

 Right Ovary
 Within normal limits.

 Adnexa
 No abnormality visualized.
Impression

 We performed fetal anatomy scan. No makers of
 aneuploidies or fetal structural defects are seen. Fetal
 biometry is consistent with her previously-established dates.
 Amniotic fluid is normal and good fetal activity is seen.
 Patient had opted not to screen for fetal aneuploidies.
Recommendations

 An appointment was made for her to return in 4 weeks for
 completion of fetal anatomy.
                 Dipp, Grefg

## 2020-11-11 IMAGING — US US MFM OB FOLLOW UP
1 series · 13 of 28 positions shown · non-contrast
Comparison: none

[Series 1: us mfm ob follow up · 13 of 59 slices shown]
[im 3/59]
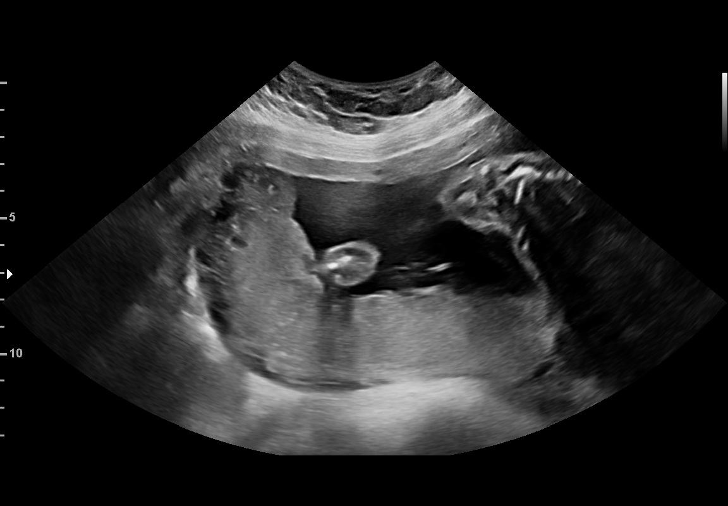
[im 7/59]
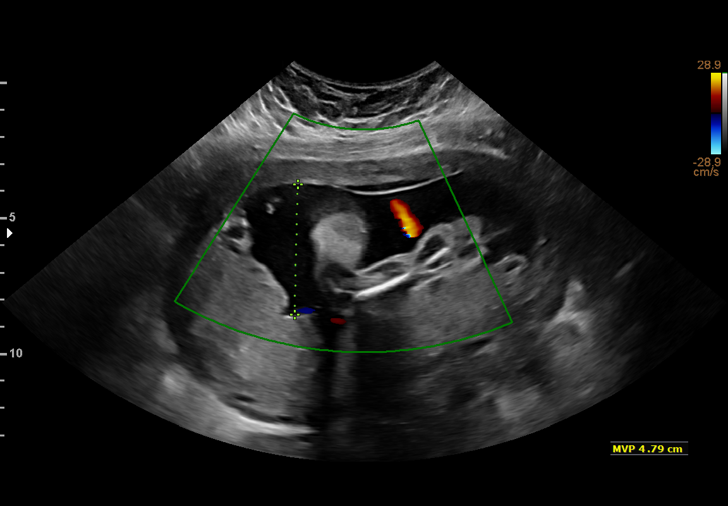
[im 11/59]
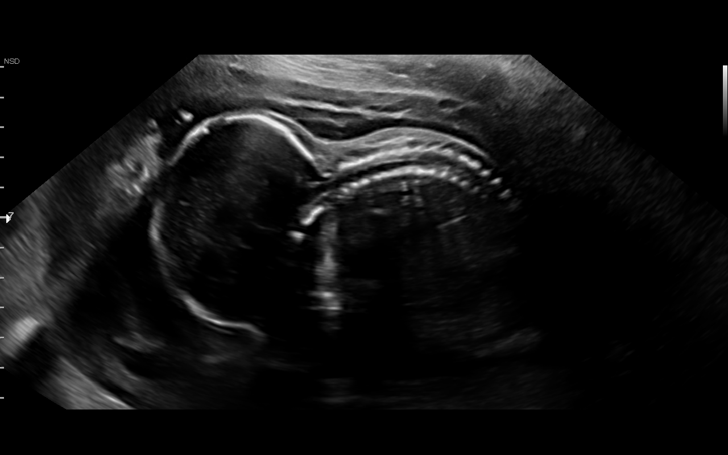
[im 16/59]
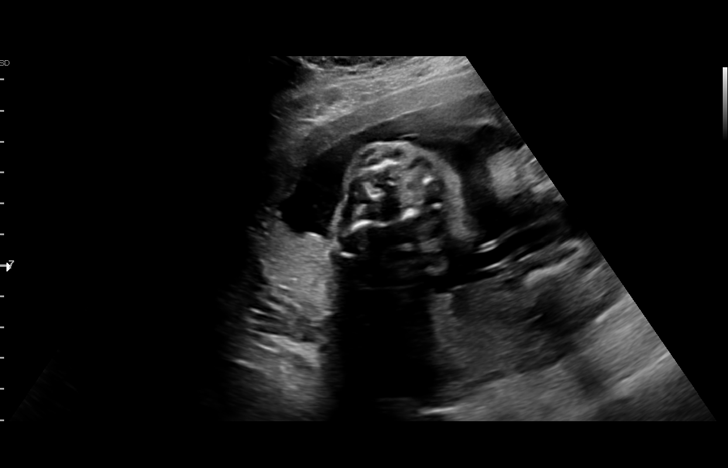
[im 20/59]
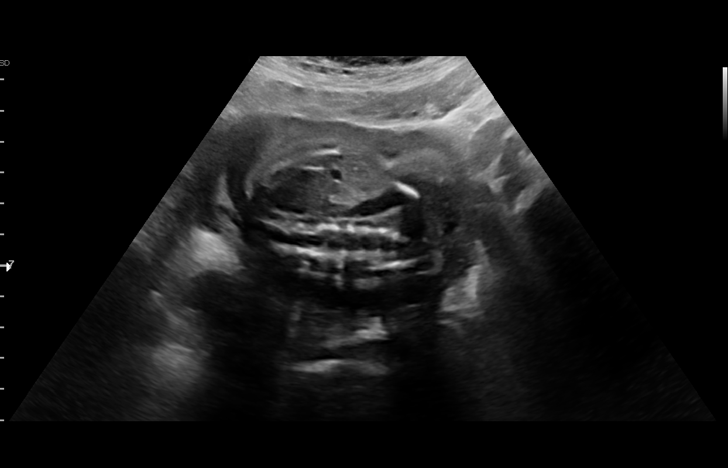
[im 24/59]
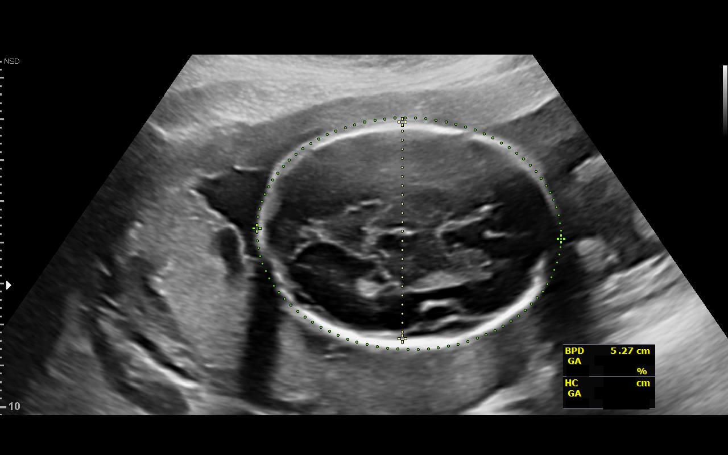
[im 31/59]
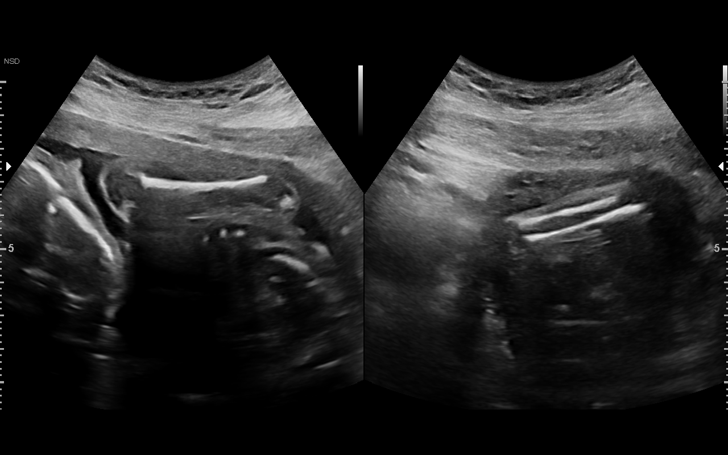
[im 35/59]
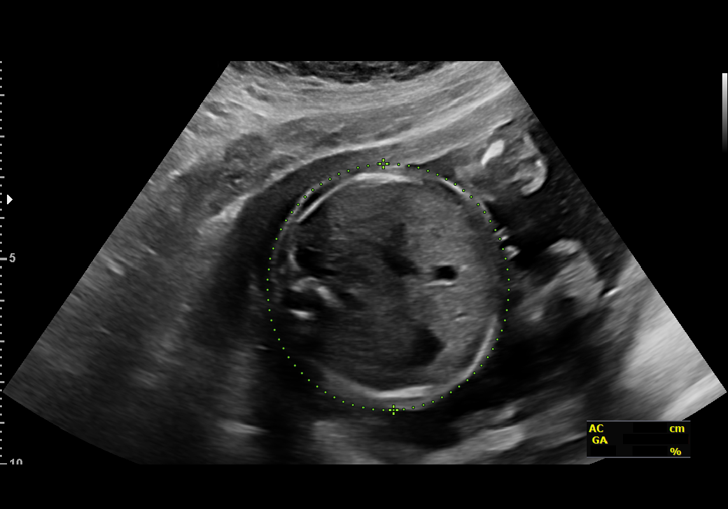
[im 39/59]
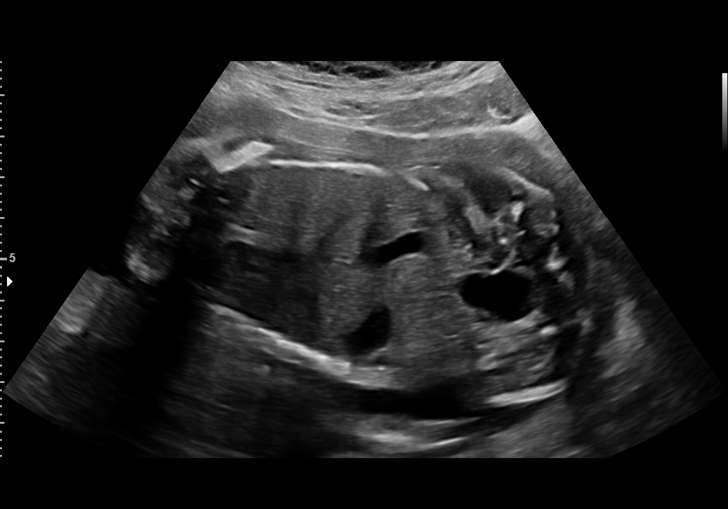
[im 43/59]
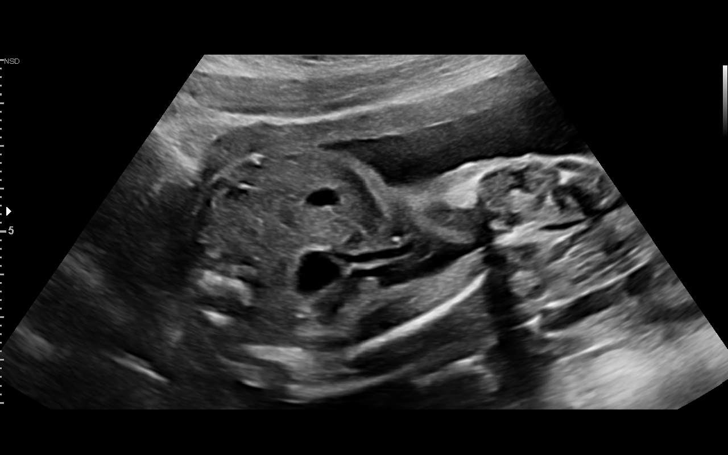
[im 48/59]
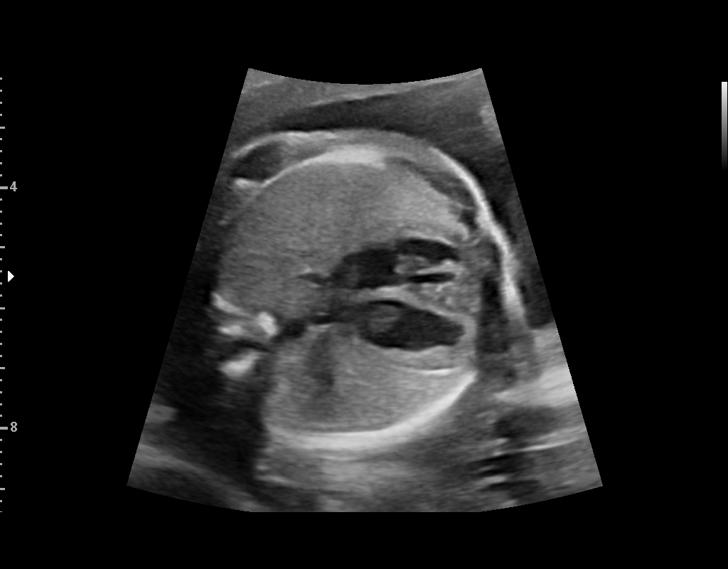
[im 52/59]
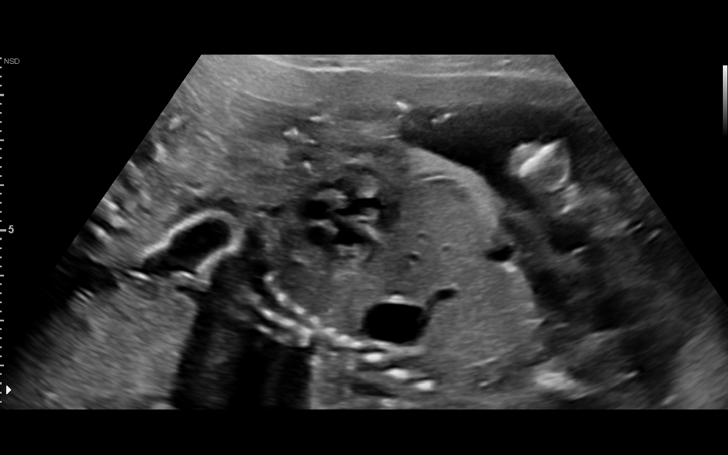
[im 56/59]
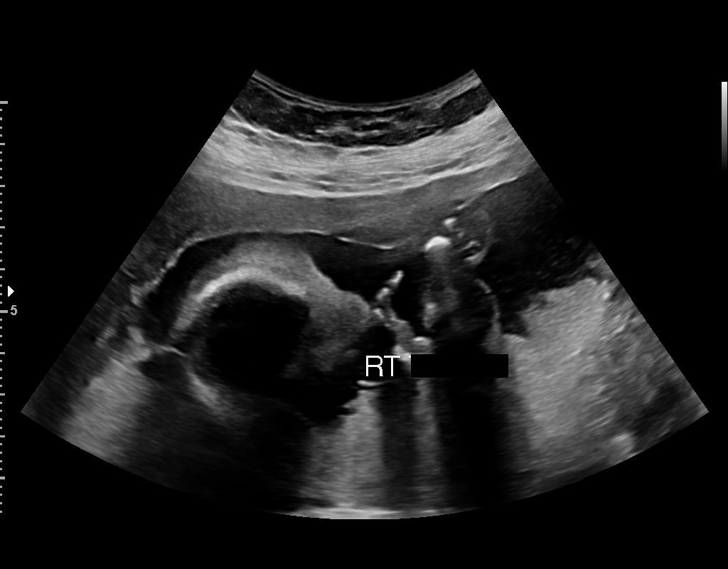

[13 of 28 positions shown; findings below may reference images not displayed]

[address]

                                                       MEZEK
 ----------------------------------------------------------------------

 ----------------------------------------------------------------------
Indications

  Antenatal follow-up for nonvisualized fetal
  anatomy
  23 weeks gestation of pregnancy
  Encounter for antenatal screening for
  malformations
  History of fetal abnormality in previous
  pregnancy, currently pregnant (Bone missing
  in thumb, removed surgically)
  Obesity complicating pregnancy, second
  trimester (Pregravid BMI 38)
 ----------------------------------------------------------------------
Vital Signs

                                                Height:        5'7"
Fetal Evaluation

 Num Of Fetuses:         1
 Fetal Heart Rate(bpm):  145
 Cardiac Activity:       Observed
 Presentation:           Frank breech
 Placenta:               Posterior
 P. Cord Insertion:      Previously Visualized

 Amniotic Fluid
 AFI FV:      Within normal limits

                             Largest Pocket(cm)

Biometry

 BPD:      52.7  mm     G. Age:  22w 0d         10  %    CI:        66.69   %    70 - 86
                                                         FL/HC:      18.4   %    19.2 -
 HC:      206.8  mm     G. Age:  22w 5d         22  %    HC/AC:      1.11        1.05 -
 AC:      186.9  mm     G. Age:  23w 3d         52  %    FL/BPD:     72.3   %    71 - 87
 FL:       38.1  mm     G. Age:  22w 2d         13  %    FL/AC:      20.4   %    20 - 24
 HUM:      38.1  mm     G. Age:  23w 3d         48  %
 LV:        5.3  mm

 Est. FW:     540  gm      1 lb 3 oz     48  %
OB History

 Gravidity:    6         Term:   3        Prem:   0        SAB:   2
 TOP:          0       Ectopic:  0        Living: 3
Gestational Age

 U/S Today:     22w 4d                                        EDD:   07/14/19
 Best:          23w 1d     Det. By:  Early Ultrasound         EDD:   07/10/19
                                     (11/18/18)
Anatomy

 Cranium:               Appears normal         LVOT:                   Previously seen
 Cavum:                 Previously seen        Aortic Arch:            Previously seen
 Ventricles:            Appears normal         Ductal Arch:            Previously seen
 Choroid Plexus:        Previously seen        Diaphragm:              Appears normal
 Cerebellum:            Previously seen        Stomach:                Appears normal, left
                                                                       sided
 Posterior Fossa:       Previously seen        Abdomen:                Appears normal
 Nuchal Fold:           Previously seen        Abdominal Wall:         Appears nml (cord
                                                                       insert, abd wall)
 Face:                  Orbits and profile     Cord Vessels:           Previously seen
                        previously seen
 Lips:                  Previously seen        Kidneys:                Appear normal
 Palate:                Not well visualized    Bladder:                Appears normal
 Thoracic:              Appears normal         Spine:                  Appears normal
 Heart:                 Appears normal         Upper Extremities:      Appears normal
                        (4CH, axis, and
                        situs)
 RVOT:                  Previously seen        Lower Extremities:      Previously seen

 Other:  Male gender previously seen. Heels, 5th digit, and Nasal bone
         previously visualized.
Cervix Uterus Adnexa

 Cervix
 Length:           3.11  cm.
 Normal appearance by transabdominal scan.

 Left Ovary
 Previously seen.

 Right Ovary
 Previously seen
Impression

 Normal interval growth.
Recommendations

 Follow up growth as clinically indicated
 Usual obstetrical care.

## 2021-05-20 ENCOUNTER — Other Ambulatory Visit (HOSPITAL_COMMUNITY)
Admission: RE | Admit: 2021-05-20 | Discharge: 2021-05-20 | Disposition: A | Discharge status: Home / Self Care | Payer: Medicaid Other | Source: Ambulatory Visit | Attending: Family Medicine | Admitting: Family Medicine

## 2021-05-20 ENCOUNTER — Ambulatory Visit (INDEPENDENT_AMBULATORY_CARE_PROVIDER_SITE_OTHER): Payer: Medicaid Other

## 2021-05-20 ENCOUNTER — Other Ambulatory Visit: Payer: Self-pay

## 2021-05-20 VITALS — BP 120/70 | HR 65 | Ht 67.0 in | Wt 252.5 lb

## 2021-05-20 DIAGNOSIS — Z113 Encounter for screening for infections with a predominantly sexual mode of transmission: Secondary | ICD-10-CM

## 2021-05-20 NOTE — Progress Notes (Signed)
Patient left before lab able to draw hiv

## 2021-05-20 NOTE — Progress Notes (Addendum)
Pt here today for STD testing. Pt states has new partner and would like testing. Denies any vaginal discharge, itching or abd pain at this time. Pt states has hx of BV and was treated 2 months ago with Metrogel Rx. Pt denies any known exposure to STDs.  Self swab collected today. Pt advised results will take 24-48 hours and will see results in mychart and will be notified if needs further treatment. Pt verbalized understanding and agreeable to plan of care.   Pt left before HIV draw. Order cancelled.   Judeth Cornfield, RN    Chart reviewed for nurse visit. Agree with plan of care.   Currie Paris, NP 05/20/2021 12:12 PM

## 2021-05-21 ENCOUNTER — Other Ambulatory Visit: Payer: Self-pay | Admitting: Nurse Practitioner

## 2021-05-21 LAB — CERVICOVAGINAL ANCILLARY ONLY
Bacterial Vaginitis (gardnerella): POSITIVE — AB
Candida Glabrata: NEGATIVE
Candida Vaginitis: NEGATIVE
Chlamydia: NEGATIVE
Comment: NEGATIVE
Comment: NEGATIVE
Comment: NEGATIVE
Comment: NEGATIVE
Comment: NEGATIVE
Comment: NORMAL
Neisseria Gonorrhea: NEGATIVE
Trichomonas: NEGATIVE

## 2021-05-21 MED ORDER — METRONIDAZOLE 0.75 % VA GEL: 1.0000 | Freq: Every day | VAGINAL | 0 refills | Status: AC

## 2021-12-04 ENCOUNTER — Ambulatory Visit: Payer: Medicaid Other

## 2021-12-10 ENCOUNTER — Other Ambulatory Visit (HOSPITAL_COMMUNITY)
Admission: RE | Admit: 2021-12-10 | Discharge: 2021-12-10 | Disposition: A | Discharge status: Home / Self Care | Payer: Medicaid Other | Source: Ambulatory Visit | Attending: Family Medicine | Admitting: Family Medicine

## 2021-12-10 ENCOUNTER — Other Ambulatory Visit: Payer: Self-pay

## 2021-12-10 ENCOUNTER — Ambulatory Visit (INDEPENDENT_AMBULATORY_CARE_PROVIDER_SITE_OTHER): Payer: Medicaid Other

## 2021-12-10 VITALS — BP 120/74 | HR 73 | Wt 236.2 lb

## 2021-12-10 DIAGNOSIS — Z113 Encounter for screening for infections with a predominantly sexual mode of transmission: Secondary | ICD-10-CM | POA: Diagnosis not present

## 2021-12-10 NOTE — Progress Notes (Signed)
Here today with complaint of vaginal odor that began last week. Patient reports using Metrogel that was previously prescribed. Patient states odor improved but has not completely resolved. Desires STD screening today. Orders placed. Self swab instructions given and specimen obtained. Explained we will contact patient with any abnormal results. ? ?Last annual was 10/18/19. Pt would like to schedule annual today during check out.  ? Fleet Contras RN ?12/10/21 ?

## 2021-12-11 DIAGNOSIS — B009 Herpesviral infection, unspecified: Secondary | ICD-10-CM | POA: Insufficient documentation

## 2021-12-11 DIAGNOSIS — Z8659 Personal history of other mental and behavioral disorders: Secondary | ICD-10-CM | POA: Insufficient documentation

## 2021-12-11 LAB — CERVICOVAGINAL ANCILLARY ONLY
Bacterial Vaginitis (gardnerella): POSITIVE — AB
Candida Glabrata: NEGATIVE
Candida Vaginitis: NEGATIVE
Chlamydia: NEGATIVE
Comment: NEGATIVE
Comment: NEGATIVE
Comment: NEGATIVE
Comment: NEGATIVE
Comment: NEGATIVE
Comment: NORMAL
Neisseria Gonorrhea: NEGATIVE
Trichomonas: NEGATIVE

## 2021-12-11 LAB — RPR: RPR Ser Ql: NONREACTIVE

## 2021-12-11 LAB — HEPATITIS B SURFACE ANTIGEN: Hepatitis B Surface Ag: NEGATIVE

## 2021-12-11 LAB — HEPATITIS C ANTIBODY: Hep C Virus Ab: NONREACTIVE

## 2021-12-11 LAB — HIV ANTIBODY (ROUTINE TESTING W REFLEX): HIV Screen 4th Generation wRfx: NONREACTIVE

## 2021-12-12 MED ORDER — METRONIDAZOLE 500 MG PO TABS: 500.0000 mg | ORAL_TABLET | Freq: Two times a day (BID) | ORAL | 0 refills | Status: AC

## 2021-12-12 NOTE — Addendum Note (Signed)
Addended by: Reva Bores on: 12/12/2021 09:16 AM ? ? Modules accepted: Orders ? ?

## 2022-02-18 ENCOUNTER — Encounter: Payer: Self-pay | Admitting: Family Medicine

## 2022-02-18 ENCOUNTER — Ambulatory Visit: Payer: Medicaid Other | Admitting: Family Medicine

## 2022-02-18 NOTE — Progress Notes (Signed)
Patient did not keep appointment today. She may call to reschedule.  

## 2022-08-17 ENCOUNTER — Ambulatory Visit: Payer: Medicaid Other | Admitting: Obstetrics and Gynecology

## 2022-09-06 ENCOUNTER — Other Ambulatory Visit (HOSPITAL_COMMUNITY)
Admission: RE | Admit: 2022-09-06 | Discharge: 2022-09-06 | Disposition: A | Discharge status: Home / Self Care | Payer: Medicaid Other | Source: Ambulatory Visit | Attending: Family Medicine | Admitting: Family Medicine

## 2022-09-06 ENCOUNTER — Other Ambulatory Visit: Payer: Medicaid Other

## 2022-09-06 ENCOUNTER — Ambulatory Visit (INDEPENDENT_AMBULATORY_CARE_PROVIDER_SITE_OTHER): Payer: Medicaid Other

## 2022-09-06 VITALS — BP 120/90 | HR 77 | Wt 260.8 lb

## 2022-09-06 DIAGNOSIS — Z113 Encounter for screening for infections with a predominantly sexual mode of transmission: Secondary | ICD-10-CM | POA: Diagnosis not present

## 2022-09-06 DIAGNOSIS — N898 Other specified noninflammatory disorders of vagina: Secondary | ICD-10-CM | POA: Diagnosis not present

## 2022-09-06 MED ORDER — METRONIDAZOLE 500 MG PO TABS: 500.0000 mg | ORAL_TABLET | Freq: Two times a day (BID) | ORAL | 0 refills | Status: DC

## 2022-09-06 NOTE — Progress Notes (Signed)
Wendy Atkins is here with complaint of vaginal odor; reports odor smells similar to when she has had BV in the past. Patient reports she has used Boric Acid with no improvement. Flagyl ordered per protocol. Desires STD screening due to unprotected intercourse as recent as last week.   Self swab instructions given and specimen obtained. Labs drawn for routine STD screening. Explained patient will be contacted with any abnormal results. Patient is due for annual exam; last annual was 10/18/19. Pt states she tried to schedule this but was told schedule for February 2024 is not open. Pt is on list for front office to call to schedule.   Marjo Bicker, RN 09/06/2022  11:36 AM

## 2022-09-07 LAB — RPR: RPR Ser Ql: NONREACTIVE

## 2022-09-07 LAB — HEPATITIS C ANTIBODY: Hep C Virus Ab: NONREACTIVE

## 2022-09-07 LAB — CERVICOVAGINAL ANCILLARY ONLY
Bacterial Vaginitis (gardnerella): NEGATIVE
Candida Glabrata: NEGATIVE
Candida Vaginitis: NEGATIVE
Chlamydia: NEGATIVE
Comment: NEGATIVE
Comment: NEGATIVE
Comment: NEGATIVE
Comment: NEGATIVE
Comment: NEGATIVE
Comment: NORMAL
Neisseria Gonorrhea: NEGATIVE
Trichomonas: NEGATIVE

## 2022-09-07 LAB — HIV ANTIBODY (ROUTINE TESTING W REFLEX): HIV Screen 4th Generation wRfx: NONREACTIVE

## 2022-09-07 LAB — HEPATITIS B SURFACE ANTIGEN: Hepatitis B Surface Ag: NEGATIVE

## 2023-04-21 ENCOUNTER — Other Ambulatory Visit: Payer: Self-pay

## 2023-04-21 ENCOUNTER — Other Ambulatory Visit (HOSPITAL_COMMUNITY)
Admission: RE | Admit: 2023-04-21 | Discharge: 2023-04-21 | Disposition: A | Discharge status: Home / Self Care | Payer: Medicaid Other | Source: Ambulatory Visit | Attending: Family Medicine | Admitting: Family Medicine

## 2023-04-21 ENCOUNTER — Ambulatory Visit (INDEPENDENT_AMBULATORY_CARE_PROVIDER_SITE_OTHER): Payer: Medicaid Other | Admitting: *Deleted

## 2023-04-21 VITALS — BP 120/77 | HR 103 | Ht 67.5 in | Wt 258.0 lb

## 2023-04-21 DIAGNOSIS — N898 Other specified noninflammatory disorders of vagina: Secondary | ICD-10-CM | POA: Diagnosis not present

## 2023-04-21 DIAGNOSIS — Z113 Encounter for screening for infections with a predominantly sexual mode of transmission: Secondary | ICD-10-CM | POA: Diagnosis not present

## 2023-04-21 NOTE — Progress Notes (Signed)
Pt presents for requested STD testing. She also reports vaginal odor x2 weeks. Self swab obtained. She will be notified of results and treatment indicated if any via Mychart. Pt voiced understanding of all information and instructions given.

## 2023-06-23 ENCOUNTER — Ambulatory Visit: Payer: Medicaid Other | Admitting: Family Medicine

## 2023-06-23 ENCOUNTER — Encounter: Payer: Self-pay | Admitting: Family Medicine

## 2023-06-23 NOTE — Progress Notes (Signed)
Patient did not keep appointment today. She will be called to reschedule.  

## 2023-08-11 DIAGNOSIS — Y30XXXA Falling, jumping or pushed from a high place, undetermined intent, initial encounter: Secondary | ICD-10-CM | POA: Diagnosis not present

## 2023-08-11 DIAGNOSIS — M545 Low back pain, unspecified: Secondary | ICD-10-CM | POA: Diagnosis not present

## 2023-08-11 DIAGNOSIS — M5489 Other dorsalgia: Secondary | ICD-10-CM | POA: Diagnosis not present

## 2023-08-11 DIAGNOSIS — M533 Sacrococcygeal disorders, not elsewhere classified: Secondary | ICD-10-CM | POA: Diagnosis not present

## 2023-10-25 ENCOUNTER — Other Ambulatory Visit: Payer: Self-pay

## 2023-10-25 ENCOUNTER — Ambulatory Visit: Payer: Medicaid Other | Admitting: Family Medicine

## 2023-10-25 ENCOUNTER — Other Ambulatory Visit (HOSPITAL_COMMUNITY)
Admission: RE | Admit: 2023-10-25 | Discharge: 2023-10-25 | Disposition: A | Discharge status: Home / Self Care | Payer: Medicaid Other | Source: Ambulatory Visit | Attending: Family Medicine | Admitting: Family Medicine

## 2023-10-25 ENCOUNTER — Encounter: Payer: Self-pay | Admitting: Family Medicine

## 2023-10-25 VITALS — BP 127/80 | HR 92 | Ht 67.0 in | Wt 260.3 lb

## 2023-10-25 DIAGNOSIS — Z114 Encounter for screening for human immunodeficiency virus [HIV]: Secondary | ICD-10-CM

## 2023-10-25 DIAGNOSIS — Z0000 Encounter for general adult medical examination without abnormal findings: Secondary | ICD-10-CM

## 2023-10-25 DIAGNOSIS — Z124 Encounter for screening for malignant neoplasm of cervix: Secondary | ICD-10-CM

## 2023-10-25 DIAGNOSIS — Z1159 Encounter for screening for other viral diseases: Secondary | ICD-10-CM

## 2023-10-25 DIAGNOSIS — Z113 Encounter for screening for infections with a predominantly sexual mode of transmission: Secondary | ICD-10-CM

## 2023-10-25 DIAGNOSIS — N898 Other specified noninflammatory disorders of vagina: Secondary | ICD-10-CM | POA: Diagnosis not present

## 2023-10-25 NOTE — Progress Notes (Unsigned)
ANNUAL EXAM Patient name: Wendy Atkins MRN [id number]  Date of birth: [birth year] Chief Complaint:   Gynecologic Exam  History of Present Illness:   Wendy Atkins is a 33 y.o. [id number] {race:25618} female being seen today for a routine annual exam.  Current complaints: ***  Patient's last menstrual period was 10/16/2023.   Upstream - 10/25/23 1453       Pregnancy Intention Screening   Does the patient want to become pregnant in the next year? No    Does the patient's partner want to become pregnant in the next year? No    Would the patient like to discuss contraceptive options today? No      Contraception Wrap Up   Current Method Female Sterilization    End Method Female Sterilization    Contraception Counseling Provided No            The pregnancy intention screening data noted above was reviewed. Potential methods of contraception were discussed. The patient elected to proceed with Female Sterilization.   Last pap ***. Results were: {Pap findings:25134}. H/O abnormal pap: {yes/yes***/no:23866} Last mammogram: ***. Results were: {normal, abnormal, n/a:23837}. Family h/o breast cancer: {yes***/no:23838} Last colonoscopy: ***. Results were: {normal, abnormal, n/a:23837}. Family h/o colorectal cancer: {yes***/no:23838}     10/25/2023    2:53 PM 12/10/2021    3:09 PM 05/28/2020    1:28 PM  Depression screen PHQ 2/9  Decreased Interest 0 0 0  Down, Depressed, Hopeless 0 0 0  PHQ - 2 Score 0 0 0  Altered sleeping 0 1 0  Tired, decreased energy 0 1 3  Change in appetite 0 1 0  Feeling bad or failure about yourself  0 0 0  Trouble concentrating 0 1 0  Moving slowly or fidgety/restless 0 0 0  Suicidal thoughts 0 0 0  PHQ-9 Score 0 4 3        10/25/2023    2:53 PM 12/10/2021    3:10 PM 05/28/2020    1:28 PM  GAD 7 : Generalized Anxiety Score  Nervous, Anxious, on Edge 0 1 0  Control/stop worrying 0 0 0  Worry too much - different things 0 0 0  Trouble relaxing 0 0 0   Restless 0 0 0  Easily annoyed or irritable 0 1 0  Afraid - awful might happen 0 0 0  Total GAD 7 Score 0 2 0     Review of Systems:   Pertinent items are noted in HPI Denies any headaches, blurred vision, fatigue, shortness of breath, chest pain, abdominal pain, abnormal vaginal discharge/itching/odor/irritation, problems with periods, bowel movements, urination, or intercourse unless otherwise stated above. Pertinent History Reviewed:  Reviewed past medical,surgical, social and family history.  Reviewed problem list, medications and allergies. Physical Assessment:   Vitals:   10/25/23 1456  BP: 127/80  Pulse: 92  Weight: 260 lb 4.8 oz (118.1 kg)  Height: 5\' 7"  (1.702 m)  Body mass index is 40.77 kg/m.        Physical Examination:   General appearance - well appearing, and in no distress  Mental status - alert, oriented to person, place, and time  Psych:  She has a normal mood and affect  Skin - warm and dry, normal color, no suspicious lesions noted  Chest - effort normal, all lung fields clear to auscultation bilaterally  Heart - normal rate and regular rhythm  Neck:  midline trachea, no thyromegaly or nodules  Breasts - breasts appear normal, no suspicious masses,  no skin or nipple changes or  axillary nodes  Abdomen - soft, nontender, nondistended, no masses or organomegaly  Pelvic - VULVA: normal appearing vulva with no masses, tenderness or lesions  VAGINA: normal appearing vagina with normal color and discharge, no lesions  CERVIX: normal appearing cervix without discharge or lesions, no CMT  Thin prep pap is {Desc; done/not:10129} *** HR HPV cotesting  UTERUS: uterus is felt to be normal size, shape, consistency and nontender   ADNEXA: No adnexal masses or tenderness noted.  Rectal - normal rectal, good sphincter tone, no masses felt. Hemoccult: ***  Extremities:  No swelling or varicosities noted  Chaperone present for exam  No results found for this or any  previous visit (from the past 24 hours).  Assessment & Plan:  1) Well-Woman Exam  2) ***  Labs/procedures today: ***  Mammogram: {Mammo f/u:25212::"@ 33yo"}, or sooner if problems Colonoscopy: {TCS f/u:25213::"@ 33yo"}, or sooner if problems  Orders Placed This Encounter  Procedures   RPR+HBsAg+HCVAb+...    Meds: No orders of the defined types were placed in this encounter.   Follow-up: No follow-ups on file.  Celedonio Savage, MD 10/25/2023 3:13 PM

## 2023-10-26 ENCOUNTER — Encounter: Payer: Self-pay | Admitting: Family Medicine

## 2023-10-26 LAB — RPR+HBSAG+HCVAB+...
HIV Screen 4th Generation wRfx: NONREACTIVE
Hep C Virus Ab: NONREACTIVE
Hepatitis B Surface Ag: NEGATIVE
RPR Ser Ql: NONREACTIVE

## 2023-10-26 LAB — CERVICOVAGINAL ANCILLARY ONLY
Bacterial Vaginitis (gardnerella): POSITIVE — AB
Candida Glabrata: NEGATIVE
Candida Vaginitis: POSITIVE — AB
Chlamydia: NEGATIVE
Comment: NEGATIVE
Comment: NEGATIVE
Comment: NEGATIVE
Comment: NEGATIVE
Comment: NEGATIVE
Comment: NORMAL
Neisseria Gonorrhea: NEGATIVE
Trichomonas: POSITIVE — AB

## 2023-10-26 MED ORDER — METRONIDAZOLE 500 MG PO TABS: 500.0000 mg | ORAL_TABLET | Freq: Two times a day (BID) | ORAL | 0 refills | Status: DC

## 2023-10-26 MED ORDER — FLUCONAZOLE 150 MG PO TABS: 150.0000 mg | ORAL_TABLET | Freq: Once | ORAL | 0 refills | Status: AC

## 2023-10-27 ENCOUNTER — Other Ambulatory Visit: Payer: Self-pay

## 2023-10-27 MED ORDER — METRONIDAZOLE 500 MG PO TABS: 500.0000 mg | ORAL_TABLET | Freq: Two times a day (BID) | ORAL | 0 refills | Status: AC

## 2023-10-28 LAB — CYTOLOGY - PAP
Comment: NEGATIVE
Diagnosis: NEGATIVE
Diagnosis: REACTIVE
High risk HPV: NEGATIVE

## 2023-11-03 ENCOUNTER — Ambulatory Visit: Payer: Medicaid Other

## 2023-11-03 ENCOUNTER — Other Ambulatory Visit: Payer: Self-pay

## 2023-12-06 ENCOUNTER — Ambulatory Visit: Payer: Medicaid Other

## 2023-12-22 ENCOUNTER — Ambulatory Visit: Payer: Medicaid Other | Admitting: Family Medicine

## 2024-03-20 ENCOUNTER — Other Ambulatory Visit: Payer: Self-pay

## 2024-03-20 ENCOUNTER — Ambulatory Visit

## 2024-03-20 ENCOUNTER — Other Ambulatory Visit (HOSPITAL_COMMUNITY)
Admission: RE | Admit: 2024-03-20 | Discharge: 2024-03-20 | Disposition: A | Discharge status: Home / Self Care | Source: Ambulatory Visit | Attending: Family Medicine | Admitting: Family Medicine

## 2024-03-20 VITALS — BP 118/69 | HR 79 | Ht 67.0 in | Wt 261.0 lb

## 2024-03-20 DIAGNOSIS — N760 Acute vaginitis: Secondary | ICD-10-CM | POA: Diagnosis present

## 2024-03-20 NOTE — Progress Notes (Signed)
 Wendy Atkins is here with concern of possible BV. Patient reports vaginal itchiness and slight odor. These symptoms have been present for 4 days.   Self swab instructions given and specimen obtained. Explained patient will be contacted with any abnormal results. Patient next annual exam is due 09/2024.   Rosaline Pendleton, RN 03/20/2024  10:10 AM

## 2024-03-22 LAB — CERVICOVAGINAL ANCILLARY ONLY
Bacterial Vaginitis (gardnerella): NEGATIVE
Candida Glabrata: NEGATIVE
Candida Vaginitis: NEGATIVE
Chlamydia: NEGATIVE
Comment: NEGATIVE
Comment: NEGATIVE
Comment: NEGATIVE
Comment: NEGATIVE
Comment: NEGATIVE
Comment: NORMAL
Neisseria Gonorrhea: NEGATIVE
Trichomonas: NEGATIVE

## 2024-05-04 ENCOUNTER — Emergency Department (HOSPITAL_COMMUNITY)

## 2024-05-04 ENCOUNTER — Encounter (HOSPITAL_COMMUNITY): Payer: Self-pay | Admitting: *Deleted

## 2024-05-04 ENCOUNTER — Other Ambulatory Visit: Payer: Self-pay

## 2024-05-04 ENCOUNTER — Emergency Department (HOSPITAL_COMMUNITY)
Admission: EM | Admit: 2024-05-04 | Discharge: 2024-05-04 | Disposition: A | Discharge status: Home / Self Care | Attending: Emergency Medicine | Admitting: Emergency Medicine

## 2024-05-04 ENCOUNTER — Other Ambulatory Visit (HOSPITAL_COMMUNITY): Payer: Self-pay

## 2024-05-04 DIAGNOSIS — Z87891 Personal history of nicotine dependence: Secondary | ICD-10-CM | POA: Diagnosis not present

## 2024-05-04 DIAGNOSIS — R1032 Left lower quadrant pain: Secondary | ICD-10-CM | POA: Diagnosis present

## 2024-05-04 DIAGNOSIS — D259 Leiomyoma of uterus, unspecified: Secondary | ICD-10-CM | POA: Diagnosis not present

## 2024-05-04 LAB — URINALYSIS, ROUTINE W REFLEX MICROSCOPIC
Bilirubin Urine: NEGATIVE
Glucose, UA: NEGATIVE mg/dL
Hgb urine dipstick: NEGATIVE
Ketones, ur: NEGATIVE mg/dL
Leukocytes,Ua: NEGATIVE
Nitrite: NEGATIVE
Protein, ur: 30 mg/dL — AB
Specific Gravity, Urine: 1.023 (ref 1.005–1.030)
pH: 5 (ref 5.0–8.0)

## 2024-05-04 LAB — CBC
HCT: 34.5 % — ABNORMAL LOW (ref 36.0–46.0)
Hemoglobin: 11.3 g/dL — ABNORMAL LOW (ref 12.0–15.0)
MCH: 27.7 pg (ref 26.0–34.0)
MCHC: 32.8 g/dL (ref 30.0–36.0)
MCV: 84.6 fL (ref 80.0–100.0)
Platelets: 367 K/uL (ref 150–400)
RBC: 4.08 MIL/uL (ref 3.87–5.11)
RDW: 13.4 % (ref 11.5–15.5)
WBC: 6.3 K/uL (ref 4.0–10.5)
nRBC: 0.3 % — ABNORMAL HIGH (ref 0.0–0.2)

## 2024-05-04 LAB — COMPREHENSIVE METABOLIC PANEL WITH GFR
ALT: 15 U/L (ref 0–44)
AST: 23 U/L (ref 15–41)
Albumin: 3.4 g/dL — ABNORMAL LOW (ref 3.5–5.0)
Alkaline Phosphatase: 79 U/L (ref 38–126)
Anion gap: 12 (ref 5–15)
BUN: 5 mg/dL — ABNORMAL LOW (ref 6–20)
CO2: 21 mmol/L — ABNORMAL LOW (ref 22–32)
Calcium: 9 mg/dL (ref 8.9–10.3)
Chloride: 107 mmol/L (ref 98–111)
Creatinine, Ser: 0.86 mg/dL (ref 0.44–1.00)
GFR, Estimated: >60 mL/min (ref >60)
Glucose, Bld: 139 mg/dL — ABNORMAL HIGH (ref 70–99)
Potassium: 3.5 mmol/L (ref 3.5–5.1)
Sodium: 140 mmol/L (ref 135–145)
Total Bilirubin: 0.9 mg/dL (ref 0.0–1.2)
Total Protein: 7.6 g/dL (ref 6.5–8.1)

## 2024-05-04 LAB — HCG, SERUM, QUALITATIVE: Preg, Serum: NEGATIVE

## 2024-05-04 LAB — LIPASE, BLOOD: Lipase: 34 U/L (ref 11–51)

## 2024-05-04 MED ORDER — ONDANSETRON HCL 4 MG/2ML IJ SOLN
4.0000 mg | Freq: Once | INTRAMUSCULAR | Status: AC
  Administered 2024-05-04: 4 mg via INTRAVENOUS
  Filled 2024-05-04: qty 2

## 2024-05-04 MED ORDER — OXYCODONE-ACETAMINOPHEN 5-325 MG PO TABS: 1.0000 | ORAL_TABLET | Freq: Four times a day (QID) | ORAL | 0 refills | Status: AC | PRN

## 2024-05-04 MED ORDER — KETOROLAC TROMETHAMINE 15 MG/ML IJ SOLN
15.0000 mg | Freq: Once | INTRAMUSCULAR | Status: AC
  Administered 2024-05-04: 15 mg via INTRAVENOUS
  Filled 2024-05-04: qty 1

## 2024-05-04 MED ORDER — LACTATED RINGERS IV BOLUS
1000.0000 mL | Freq: Once | INTRAVENOUS | Status: AC
  Administered 2024-05-04: 1000 mL via INTRAVENOUS

## 2024-05-04 MED ORDER — IBUPROFEN 600 MG PO TABS: 600.0000 mg | ORAL_TABLET | Freq: Four times a day (QID) | ORAL | 0 refills | Status: AC | PRN

## 2024-05-04 NOTE — ED Notes (Signed)
 Pt verbalized understanding of discharge instructions. Pt ambulatory at time of discharge.

## 2024-05-04 NOTE — ED Triage Notes (Signed)
 Patient c/o left lower abd. Pain onset 9pm , states she hasn't been able to sleep all night. C/o feeling like she was going to pass out several times the past 2 days. States she thinks she may be on her period now started bleeding on Wed. However this period has been  abnormal

## 2024-05-04 NOTE — Discharge Instructions (Addendum)
 We evaluated you for your abdominal pain.  Your ultrasound showed many fibroids in your uterus.  These are benign tumors that can cause pain during menstrual cycles.  Please follow-up with your OB/GYN to discuss this further.  Please take Motrin  (ibuprofen ) for your symptoms at home.  You can take 600 mg of Motrin  every 6 hours as needed for your symptoms.    We have given you a small amount of percocet which you can take for pain not relieved by Motrin .  Do not drink alcohol, drive, or operate machinery when taking this medicine.  Please return for any new or worsening symptoms.

## 2024-05-04 NOTE — ED Notes (Signed)
 Pt at ultrasound

## 2024-05-04 NOTE — ED Notes (Signed)
 Patient transported to Ultrasound

## 2024-05-04 NOTE — ED Provider Notes (Signed)
 [facility] Provider Note  CSN: [id number] Arrival date & time: 05/04/24 9367  Chief Complaint(s) Abdominal Pain  HPI Wendy Atkins is a 33 y.o. female without significant past medical history presenting to the emergency department with left lower quadrant pain.  Patient reports pain began last night.  Reports pain is constant.  Reports associated nausea, no vomiting.  No syncope.  No urinary symptoms.  No vaginal discharge.  Does report she is on her menstrual cycle.  No fevers or chills.  Initially felt similar to menstrual cramp pain but has worsened.   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 Authorizing Provider  ibuprofen  (ADVIL ) 600 MG tablet Take 1 tablet (600 mg total) by mouth every 6 (six) hours as needed. 05/04/24  Yes Francesca Elsie CROME, MD  LYSINE PO Take 1 tablet by mouth daily.   Yes [provider]  Multiple Vitamins-Minerals (ZINC PO) Take 1 tablet by mouth daily.   Yes [provider]  NONFORMULARY OR COMPOUNDED ITEM Take 1 application  by mouth See admin instructions. #77 hydroquinone 8%, niancinamide 4%, kojic acid 1%, tretinoin 0.1%, fluocinolone 0.025% gel  Apply to affected area twice daily - spot treat on face only, apply all over affected areas on body.   Yes [provider]  oxyCODONE -acetaminophen  (PERCOCET/ROXICET) 5-325 MG tablet Take 1  tablet by mouth every 6 (six) hours as needed for severe pain (pain score 7-10). 05/04/24  Yes Francesca Elsie CROME, MD  VITAMIN E PO Take 1 capsule by mouth daily.   Yes [provider]                                                                                                                                    Past Surgical History Past Surgical History:  Procedure Laterality Date   DILATION AND EVACUATION N/A 08/04/2018   Procedure: DILATATION AND EVACUATION - Ultrasound Guided;  Surgeon: Nicholaus Burnard HERO, MD;  Location: [facility];  Service: Gynecology;  Laterality: N/A;   LAPAROSCOPIC TUBAL LIGATION Bilateral 09/12/2019   Procedure: LAPAROSCOPIC TUBAL LIGATION;  Surgeon: Fredirick Glenys RAMAN, MD;  Location: [facility];  Service: Gynecology;  Laterality: Bilateral;   Family History Family History  Problem Relation Age of Onset   Diabetes Mother  Diabetes Father    Hypertension Father     Social History Social History   Tobacco Use   Smoking status: Former    Current packs/day: 0.00    Average packs/day: 0.1 packs/day for 5.0 years (0.5 ttl pk-yrs)    Types: Cigarettes    Start date: 06/11/2013    Quit date: 06/11/2018    Years since quitting: 5.9   Smokeless tobacco: Never  Vaping Use   Vaping status: Never Used  Substance Use Topics   Alcohol use: Yes    Alcohol/week: 10.0 standard drinks of alcohol    Types: 10 Shots of liquor per week    Comment: 5-6 at a time, usually twice a week   Drug use: No   Allergies Cleocin  [clindamycin ]  Review of Systems Review of Systems  All other systems reviewed and are negative.   Physical Exam Vital Signs  I have reviewed the triage vital signs BP 92/68   Pulse 62   Temp 98.5 F (36.9 C)   Resp 16   Ht 5' 7.5 (1.715 m)   Wt 117.9 kg   SpO2 100%   BMI 40.12 kg/m  Physical Exam Vitals and nursing note reviewed.  Constitutional:      General: She is not in acute distress.    Appearance: She is  well-developed.  HENT:     Head: Normocephalic and atraumatic.     Mouth/Throat:     Mouth: Mucous membranes are moist.  Eyes:     Pupils: Pupils are equal, round, and reactive to light.  Cardiovascular:     Rate and Rhythm: Normal rate and regular rhythm.     Heart sounds: No murmur heard. Pulmonary:     Effort: Pulmonary effort is normal. No respiratory distress.     Breath sounds: Normal breath sounds.  Abdominal:     General: Abdomen is flat.     Palpations: Abdomen is soft.     Tenderness: There is abdominal tenderness in the left lower quadrant. There is no right CVA tenderness or left CVA tenderness.  Musculoskeletal:        General: No tenderness.     Right lower leg: No edema.     Left lower leg: No edema.  Skin:    General: Skin is warm and dry.  Neurological:     General: No focal deficit present.     Mental Status: She is alert. Mental status is at baseline.  Psychiatric:        Mood and Affect: Mood normal.        Behavior: Behavior normal.     ED Results and Treatments Labs (all labs ordered are listed, but only abnormal results are displayed) Labs Reviewed  COMPREHENSIVE METABOLIC PANEL WITH GFR - Abnormal; Notable for the following components:      Result Value   CO2 21 (*)    Glucose, Bld 139 (*)    BUN 5 (*)    Albumin 3.4 (*)    All other components within normal limits  CBC - Abnormal; Notable for the following components:   Hemoglobin 11.3 (*)    HCT 34.5 (*)    nRBC 0.3 (*)    All other components within normal limits  URINALYSIS, ROUTINE W REFLEX MICROSCOPIC - Abnormal; Notable for the following components:   APPearance HAZY (*)    Protein, ur 30 (*)    Bacteria, UA RARE (*)    All other components within normal limits  LIPASE, BLOOD  HCG,  SERUM, QUALITATIVE                                                                                                                          Radiology US  PELVIC COMPLETE W TRANSVAGINAL AND TORSION  R/O Result Date: 05/04/2024 EXAM: US  Pelvis, Complete Transvaginal and Transabdominal with Doppler 05/04/2024 09:53:47 AM TECHNIQUE: Transabdominal and transvaginal pelvic duplex ultrasound using B-mode/gray scaled imaging with Doppler spectral analysis and color flow was obtained. COMPARISON: None available CLINICAL HISTORY: Pelvic pain FINDINGS: UTERUS: Uterus measures 9.4 x 5.3 x 6.5 cm and is heterogeneous in echogenicity throughout. There are multiple uterine fibroids. There is an anterior mural fibroid measuring 8 x 8 x 10 mm. There is a posterior fibroid measuring 13 x 11 x 16 mm and an additional fibroid measuring 18 x 18 x 16 mm. ENDOMETRIAL STRIPE: Endometrial stripe measures 4.4 mm. There is trace fluid within the endometrium. RIGHT OVARY: Right ovary measures 2.5 x 2.1 x 1.9 cm, with an estimated volume of 5.3 ml. LEFT OVARY: Left ovary measures 3.1 x 2.3 x 2.3 cm, with an estimated volume of 8.3 ml. FREE FLUID: No free fluid. IMPRESSION: 1. Multiple uterine fibroids, including an anterior mural fibroid measuring 8 x 8 x 10 mm, a posterior fibroid measuring 13 x 11 x 16 mm, and an additional fibroid measuring 18 x 18 x 16 mm. 2. Trace fluid within the endometrium. Electronically signed by: evalene coho 05/04/2024 10:26 AM EDT RP Workstation: [id number]    Pertinent labs & imaging results that were available during my care of the patient were reviewed by me and considered in my medical decision making (see MDM for details).  Medications Ordered in ED Medications  ondansetron  (ZOFRAN ) injection 4 mg (4 mg Intravenous Given 05/04/24 0819)  ketorolac  (TORADOL ) 15 MG/ML injection 15 mg (15 mg Intravenous Given 05/04/24 0819)  lactated ringers  bolus 1,000 mL (1,000 mLs Intravenous New Bag/Given 05/04/24 0817)                                                                                                                                     Procedures Procedures  (including critical care  time)  Medical Decision Making / ED Course   MDM:  33 year old presents to the emergency department for left lower quadrant pain.  Patient overall well-appearing, vital signs reassuring.  Does have a left lower quadrant tenderness.  Will obtain  labs, urinalysis, pregnancy test.  Differential includes ovarian cyst rupture, torsion, ectopic pregnancy, diverticulitis, colitis.  Seems more consistent with pelvic cause of pain so we will obtain ultrasound and reassess.  If this is unrevealing may need further imaging   Clinical Course as of 05/04/24 1153  Fri May 04, 2024  1152 Patient reports that her symptoms resolved after getting Toradol .  Her ultrasound shows no ovarian torsion or masses, shows fibroid uterus.  She is on her menstrual cycle so this would explain her pain.  Since her pain is resolving she has no tenderness on repeat abdominal exam, labs otherwise reassuring without leukocytosis feel patient is stable for discharge. Will discharge patient to home. All questions answered. Patient comfortable with plan of discharge. Return precautions discussed with patient and specified on the after visit summary.  [WS]    Clinical Course User Index [WS] Francesca Elsie CROME, MD     Additional history obtained:  -External records from outside source obtained and reviewed including: Chart review including previous notes, labs, imaging, consultation notes including prior ob notes    Lab Tests: -I ordered, reviewed, and interpreted labs.   The pertinent results include:   Labs Reviewed  COMPREHENSIVE METABOLIC PANEL WITH GFR - Abnormal; Notable for the following components:      Result Value   CO2 21 (*)    Glucose, Bld 139 (*)    BUN 5 (*)    Albumin 3.4 (*)    All other components within normal limits  CBC - Abnormal; Notable for the following components:   Hemoglobin 11.3 (*)    HCT 34.5 (*)    nRBC 0.3 (*)    All other components within normal limits  URINALYSIS, ROUTINE W  REFLEX MICROSCOPIC - Abnormal; Notable for the following components:   APPearance HAZY (*)    Protein, ur 30 (*)    Bacteria, UA RARE (*)    All other components within normal limits  LIPASE, BLOOD  HCG, SERUM, QUALITATIVE    Notable for no leukocytosis, mild anemia, not pregnant   Imaging Studies ordered: I ordered imaging studies including TVUS  On my interpretation imaging demonstrates fibroids I independently visualized and interpreted imaging. I agree with the radiologist interpretation   Medicines ordered and prescription drug management: Meds ordered this encounter  Medications   ondansetron  (ZOFRAN ) injection 4 mg   ketorolac  (TORADOL ) 15 MG/ML injection 15 mg   lactated ringers  bolus 1,000 mL   oxyCODONE -acetaminophen  (PERCOCET/ROXICET) 5-325 MG tablet    Sig: Take 1 tablet by mouth every 6 (six) hours as needed for severe pain (pain score 7-10).    Dispense:  5 tablet    Refill:  0   ibuprofen  (ADVIL ) 600 MG tablet    Sig: Take 1 tablet (600 mg total) by mouth every 6 (six) hours as needed.    Dispense:  30 tablet    Refill:  0    -I have reviewed the patients home medicines and have made adjustments as needed  Social Determinants of Health:  Diagnosis or treatment significantly limited by social determinants of health: obesity   Reevaluation: After the interventions noted above, I reevaluated the patient and found that their symptoms have improved  Co morbidities that complicate the patient evaluation  Past Medical History:  Diagnosis Date   Bacterial vaginitis 06/27/2018   Depression    doing good now   Gonorrhea    Medical history non-contributory    Pregnant    SVD (spontaneous vaginal delivery)  x 3      Dispostion: Disposition decision including need for hospitalization was considered, and patient discharged from emergency department.    Final Clinical Impression(s) / ED Diagnoses Final diagnoses:  Uterine leiomyoma, unspecified  location     This chart was dictated using voice recognition software.  Despite best efforts to proofread,  errors can occur which can change the documentation meaning.    Francesca Elsie CROME, MD 05/04/24 1153

## 2024-08-27 ENCOUNTER — Other Ambulatory Visit (HOSPITAL_COMMUNITY)
Admission: RE | Admit: 2024-08-27 | Discharge: 2024-08-27 | Disposition: A | Source: Ambulatory Visit | Attending: Family Medicine | Admitting: Family Medicine

## 2024-08-27 ENCOUNTER — Ambulatory Visit

## 2024-08-27 ENCOUNTER — Other Ambulatory Visit: Payer: Self-pay

## 2024-08-27 VITALS — BP 123/62 | HR 90 | Ht 67.5 in | Wt 261.8 lb

## 2024-08-27 DIAGNOSIS — Z113 Encounter for screening for infections with a predominantly sexual mode of transmission: Secondary | ICD-10-CM | POA: Insufficient documentation

## 2024-08-27 NOTE — Progress Notes (Signed)
 Pt here today for self swab for vaginal discharge and STD screening.  Pt reports vaginal discharge that is clear but usually gets BV around her period time so wants to get checked out.  Pt explained how to obtain self swab and that we will call with abnormal results.   Pt verbalized understanding.     Mordecai Tindol,RN  08/27/24

## 2024-08-28 LAB — CERVICOVAGINAL ANCILLARY ONLY
Bacterial Vaginitis (gardnerella): POSITIVE — AB
Candida Glabrata: NEGATIVE
Candida Vaginitis: NEGATIVE
Chlamydia: NEGATIVE
Comment: NEGATIVE
Comment: NEGATIVE
Comment: NEGATIVE
Comment: NEGATIVE
Comment: NEGATIVE
Comment: NORMAL
Neisseria Gonorrhea: NEGATIVE
Trichomonas: NEGATIVE

## 2024-08-29 ENCOUNTER — Ambulatory Visit: Payer: Self-pay | Admitting: Obstetrics & Gynecology

## 2024-08-29 MED ORDER — METRONIDAZOLE 500 MG PO TABS: 500.0000 mg | ORAL_TABLET | Freq: Two times a day (BID) | ORAL | 0 refills | Status: AC
